# Patient Record
Sex: Male | Born: 1940 | Race: White | Hispanic: No | Marital: Married | Smoking: Former smoker
Health system: Southern US, Community
[De-identification: ages and names within clinical notes are randomized; demographics above are authoritative.]

## PROBLEM LIST (undated history)

## (undated) DIAGNOSIS — I10 Essential (primary) hypertension: Secondary | ICD-10-CM

## (undated) DIAGNOSIS — IMO0001 Reserved for inherently not codable concepts without codable children: Secondary | ICD-10-CM

## (undated) DIAGNOSIS — R972 Elevated prostate specific antigen [PSA]: Secondary | ICD-10-CM

## (undated) DIAGNOSIS — E079 Disorder of thyroid, unspecified: Secondary | ICD-10-CM

## (undated) DIAGNOSIS — K219 Gastro-esophageal reflux disease without esophagitis: Secondary | ICD-10-CM

## (undated) DIAGNOSIS — N400 Enlarged prostate without lower urinary tract symptoms: Secondary | ICD-10-CM

## (undated) DIAGNOSIS — E785 Hyperlipidemia, unspecified: Secondary | ICD-10-CM

## (undated) HISTORY — DX: Benign prostatic hyperplasia without lower urinary tract symptoms: N40.0

## (undated) HISTORY — DX: Hyperlipidemia, unspecified: E78.5

## (undated) HISTORY — DX: Elevated prostate specific antigen (PSA): R97.20

## (undated) HISTORY — DX: Disorder of thyroid, unspecified: E07.9

## (undated) HISTORY — DX: Reserved for inherently not codable concepts without codable children: IMO0001

## (undated) HISTORY — PX: PROSTATE BIOPSY: SHX241

## (undated) HISTORY — PX: WISDOM TOOTH EXTRACTION: SHX21

## (undated) HISTORY — DX: Gastro-esophageal reflux disease without esophagitis: K21.9

## (undated) HISTORY — DX: Essential (primary) hypertension: I10

## (undated) HISTORY — PX: TONSILLECTOMY: SUR1361

---

## 2001-03-13 ENCOUNTER — Other Ambulatory Visit: Admission: RE | Admit: 2001-03-13 | Discharge: 2001-03-13 | Payer: Self-pay | Admitting: Internal Medicine

## 2001-03-13 ENCOUNTER — Encounter (INDEPENDENT_AMBULATORY_CARE_PROVIDER_SITE_OTHER): Payer: Self-pay | Admitting: Specialist

## 2004-04-25 ENCOUNTER — Ambulatory Visit: Payer: Self-pay | Admitting: Family Medicine

## 2004-05-02 ENCOUNTER — Ambulatory Visit: Payer: Self-pay | Admitting: Family Medicine

## 2004-05-09 ENCOUNTER — Encounter: Admission: RE | Admit: 2004-05-09 | Discharge: 2004-05-09 | Payer: Self-pay | Admitting: Family Medicine

## 2004-07-19 ENCOUNTER — Ambulatory Visit: Payer: Self-pay | Admitting: Family Medicine

## 2004-07-24 ENCOUNTER — Encounter: Admission: RE | Admit: 2004-07-24 | Discharge: 2004-07-24 | Payer: Self-pay | Admitting: Family Medicine

## 2005-02-01 ENCOUNTER — Ambulatory Visit: Payer: Self-pay | Admitting: Family Medicine

## 2005-02-23 ENCOUNTER — Ambulatory Visit: Payer: Self-pay | Admitting: Internal Medicine

## 2005-04-25 ENCOUNTER — Ambulatory Visit: Payer: Self-pay | Admitting: Family Medicine

## 2005-06-14 ENCOUNTER — Ambulatory Visit: Payer: Self-pay | Admitting: Family Medicine

## 2005-06-21 ENCOUNTER — Ambulatory Visit: Payer: Self-pay | Admitting: Family Medicine

## 2005-11-15 ENCOUNTER — Ambulatory Visit: Payer: Self-pay | Admitting: Family Medicine

## 2005-11-16 ENCOUNTER — Ambulatory Visit: Payer: Self-pay | Admitting: Family Medicine

## 2005-11-20 ENCOUNTER — Ambulatory Visit: Payer: Self-pay | Admitting: Internal Medicine

## 2006-03-15 ENCOUNTER — Ambulatory Visit: Payer: Self-pay | Admitting: Family Medicine

## 2006-05-07 ENCOUNTER — Ambulatory Visit: Payer: Self-pay | Admitting: Family Medicine

## 2006-06-06 ENCOUNTER — Ambulatory Visit: Payer: Self-pay | Admitting: Internal Medicine

## 2006-07-09 ENCOUNTER — Ambulatory Visit: Payer: Self-pay | Admitting: Internal Medicine

## 2006-07-09 ENCOUNTER — Encounter (INDEPENDENT_AMBULATORY_CARE_PROVIDER_SITE_OTHER): Payer: Self-pay | Admitting: *Deleted

## 2006-07-11 ENCOUNTER — Ambulatory Visit: Payer: Self-pay | Admitting: Family Medicine

## 2006-07-11 LAB — CONVERTED CEMR LAB
ALT: 23 units/L (ref 0–40)
AST: 26 units/L (ref 0–37)
Albumin: 3.9 g/dL (ref 3.5–5.2)
Alkaline Phosphatase: 47 units/L (ref 39–117)
BUN: 14 mg/dL (ref 6–23)
Basophils Absolute: 0 10*3/uL (ref 0.0–0.1)
Basophils Relative: 0.4 % (ref 0.0–1.0)
CO2: 27 meq/L (ref 19–32)
Calcium: 9.5 mg/dL (ref 8.4–10.5)
Chloride: 106 meq/L (ref 96–112)
Cholesterol: 182 mg/dL (ref 0–200)
Creatinine, Ser: 1 mg/dL (ref 0.4–1.5)
Eosinophils Relative: 4.2 % (ref 0.0–5.0)
GFR calc Af Amer: 97 mL/min
GFR calc non Af Amer: 80 mL/min
Glucose, Bld: 110 mg/dL — ABNORMAL HIGH (ref 70–99)
HCT: 43.7 % (ref 39.0–52.0)
HDL: 47.5 mg/dL (ref >39.0)
Hemoglobin: 14.8 g/dL (ref 13.0–17.0)
Hgb A1c MFr Bld: 4.9 % (ref 4.6–6.0)
LDL Cholesterol: 117 mg/dL — ABNORMAL HIGH (ref 0–99)
Lymphocytes Relative: 38.7 % (ref 12.0–46.0)
MCHC: 33.8 g/dL (ref 30.0–36.0)
MCV: 96.5 fL (ref 78.0–100.0)
Monocytes Absolute: 0.4 10*3/uL (ref 0.2–0.7)
Monocytes Relative: 8.1 % (ref 3.0–11.0)
Neutro Abs: 2.4 10*3/uL (ref 1.4–7.7)
Neutrophils Relative %: 48.6 % (ref 43.0–77.0)
PSA: 7.77 ng/mL — ABNORMAL HIGH (ref 0.10–4.00)
Platelets: 155 10*3/uL (ref 150–400)
Potassium: 3.6 meq/L (ref 3.5–5.1)
RBC: 4.53 M/uL (ref 4.22–5.81)
RDW: 12.1 % (ref 11.5–14.6)
Sodium: 139 meq/L (ref 135–145)
TSH: 1.17 microintl units/mL (ref 0.35–5.50)
Total Bilirubin: 1.2 mg/dL (ref 0.3–1.2)
Total CHOL/HDL Ratio: 3.8
Total Protein: 6 g/dL (ref 6.0–8.3)
Triglycerides: 90 mg/dL (ref 0–149)
VLDL: 18 mg/dL (ref 0–40)
WBC: 4.9 10*3/uL (ref 4.5–10.5)

## 2006-07-17 ENCOUNTER — Ambulatory Visit: Payer: Self-pay | Admitting: Family Medicine

## 2006-10-28 ENCOUNTER — Ambulatory Visit: Payer: Self-pay | Admitting: Family Medicine

## 2007-04-17 DIAGNOSIS — E785 Hyperlipidemia, unspecified: Secondary | ICD-10-CM

## 2007-04-17 DIAGNOSIS — E039 Hypothyroidism, unspecified: Secondary | ICD-10-CM

## 2007-04-25 ENCOUNTER — Ambulatory Visit: Payer: Self-pay | Admitting: Family Medicine

## 2007-05-21 ENCOUNTER — Ambulatory Visit: Payer: Self-pay | Admitting: Family Medicine

## 2007-05-21 DIAGNOSIS — J309 Allergic rhinitis, unspecified: Secondary | ICD-10-CM | POA: Insufficient documentation

## 2007-08-27 ENCOUNTER — Ambulatory Visit: Payer: Self-pay | Admitting: Family Medicine

## 2007-08-27 LAB — CONVERTED CEMR LAB
Bilirubin Urine: NEGATIVE
Blood in Urine, dipstick: NEGATIVE
Glucose, Urine, Semiquant: NEGATIVE
Nitrite: NEGATIVE
Protein, U semiquant: NEGATIVE
Specific Gravity, Urine: 1.02
Urobilinogen, UA: 0.2
WBC Urine, dipstick: NEGATIVE
pH: 7

## 2007-08-29 LAB — CONVERTED CEMR LAB
ALT: 25 units/L (ref 0–53)
AST: 23 units/L (ref 0–37)
Albumin: 4 g/dL (ref 3.5–5.2)
Alkaline Phosphatase: 53 units/L (ref 39–117)
BUN: 21 mg/dL (ref 6–23)
Basophils Absolute: 0 10*3/uL (ref 0.0–0.1)
Basophils Relative: 0.2 % (ref 0.0–1.0)
Bilirubin, Direct: 0.2 mg/dL (ref 0.0–0.3)
CO2: 28 meq/L (ref 19–32)
Calcium: 10.1 mg/dL (ref 8.4–10.5)
Chloride: 105 meq/L (ref 96–112)
Cholesterol: 168 mg/dL (ref 0–200)
Creatinine, Ser: 1.2 mg/dL (ref 0.4–1.5)
Eosinophils Absolute: 0.2 10*3/uL (ref 0.0–0.6)
Eosinophils Relative: 3 % (ref 0.0–5.0)
GFR calc Af Amer: 78 mL/min
GFR calc non Af Amer: 65 mL/min
Glucose, Bld: 94 mg/dL (ref 70–99)
HCT: 46 % (ref 39.0–52.0)
HDL: 42.4 mg/dL (ref >39.0)
Hemoglobin: 15.3 g/dL (ref 13.0–17.0)
LDL Cholesterol: 106 mg/dL — ABNORMAL HIGH (ref 0–99)
Lymphocytes Relative: 33.5 % (ref 12.0–46.0)
MCHC: 33.3 g/dL (ref 30.0–36.0)
MCV: 92.7 fL (ref 78.0–100.0)
Monocytes Absolute: 0.4 10*3/uL (ref 0.2–0.7)
Monocytes Relative: 7.3 % (ref 3.0–11.0)
Neutro Abs: 3.4 10*3/uL (ref 1.4–7.7)
Neutrophils Relative %: 56 % (ref 43.0–77.0)
PSA: 9.91 ng/mL — ABNORMAL HIGH (ref 0.10–4.00)
Platelets: 198 10*3/uL (ref 150–400)
Potassium: 4.8 meq/L (ref 3.5–5.1)
RBC: 4.96 M/uL (ref 4.22–5.81)
RDW: 12 % (ref 11.5–14.6)
Sodium: 140 meq/L (ref 135–145)
TSH: 1.3 microintl units/mL (ref 0.35–5.50)
Total Bilirubin: 1 mg/dL (ref 0.3–1.2)
Total CHOL/HDL Ratio: 4
Total Protein: 6.4 g/dL (ref 6.0–8.3)
Triglycerides: 97 mg/dL (ref 0–149)
VLDL: 19 mg/dL (ref 0–40)
WBC: 6 10*3/uL (ref 4.5–10.5)

## 2007-09-01 ENCOUNTER — Ambulatory Visit: Payer: Self-pay | Admitting: Family Medicine

## 2007-09-01 DIAGNOSIS — M1990 Unspecified osteoarthritis, unspecified site: Secondary | ICD-10-CM

## 2007-09-01 DIAGNOSIS — N401 Enlarged prostate with lower urinary tract symptoms: Secondary | ICD-10-CM

## 2007-09-01 DIAGNOSIS — N138 Other obstructive and reflux uropathy: Secondary | ICD-10-CM

## 2007-09-01 DIAGNOSIS — K219 Gastro-esophageal reflux disease without esophagitis: Secondary | ICD-10-CM | POA: Insufficient documentation

## 2007-12-01 ENCOUNTER — Ambulatory Visit: Payer: Self-pay | Admitting: Family Medicine

## 2007-12-08 ENCOUNTER — Ambulatory Visit: Payer: Self-pay

## 2007-12-08 ENCOUNTER — Encounter: Payer: Self-pay | Admitting: Family Medicine

## 2008-03-22 ENCOUNTER — Ambulatory Visit: Payer: Self-pay | Admitting: Family Medicine

## 2008-05-04 ENCOUNTER — Ambulatory Visit: Payer: Self-pay | Admitting: Family Medicine

## 2008-09-16 ENCOUNTER — Ambulatory Visit: Payer: Self-pay | Admitting: Family Medicine

## 2008-09-16 LAB — CONVERTED CEMR LAB
Bilirubin Urine: NEGATIVE
Blood in Urine, dipstick: NEGATIVE
Glucose, Urine, Semiquant: NEGATIVE
Nitrite: NEGATIVE
Protein, U semiquant: NEGATIVE
Specific Gravity, Urine: 1.015
Urobilinogen, UA: 0.2
WBC Urine, dipstick: NEGATIVE
pH: 7

## 2008-09-17 LAB — CONVERTED CEMR LAB
ALT: 30 units/L (ref 0–53)
AST: 26 units/L (ref 0–37)
Albumin: 4.2 g/dL (ref 3.5–5.2)
Alkaline Phosphatase: 50 units/L (ref 39–117)
BUN: 17 mg/dL (ref 6–23)
Basophils Absolute: 0.1 10*3/uL (ref 0.0–0.1)
Basophils Relative: 1 % (ref 0.0–3.0)
Bilirubin, Direct: 0 mg/dL (ref 0.0–0.3)
CO2: 31 meq/L (ref 19–32)
Calcium: 9.3 mg/dL (ref 8.4–10.5)
Chloride: 106 meq/L (ref 96–112)
Cholesterol: 152 mg/dL (ref 0–200)
Creatinine, Ser: 0.9 mg/dL (ref 0.4–1.5)
Eosinophils Absolute: 0.2 10*3/uL (ref 0.0–0.7)
Eosinophils Relative: 3.8 % (ref 0.0–5.0)
GFR calc non Af Amer: 89.88 mL/min (ref >60)
Glucose, Bld: 84 mg/dL (ref 70–99)
HCT: 43.6 % (ref 39.0–52.0)
HDL: 39.5 mg/dL (ref >39.00)
Hemoglobin: 15.4 g/dL (ref 13.0–17.0)
LDL Cholesterol: 96 mg/dL (ref 0–99)
Lymphocytes Relative: 36.5 % (ref 12.0–46.0)
Lymphs Abs: 1.9 10*3/uL (ref 0.7–4.0)
MCHC: 35.4 g/dL (ref 30.0–36.0)
MCV: 92.3 fL (ref 78.0–100.0)
Monocytes Absolute: 0.4 10*3/uL (ref 0.1–1.0)
Monocytes Relative: 7.6 % (ref 3.0–12.0)
Neutro Abs: 2.6 10*3/uL (ref 1.4–7.7)
Neutrophils Relative %: 51.1 % (ref 43.0–77.0)
PSA: 7.63 ng/mL — ABNORMAL HIGH (ref 0.10–4.00)
Platelets: 145 10*3/uL — ABNORMAL LOW (ref 150.0–400.0)
Potassium: 3.8 meq/L (ref 3.5–5.1)
RBC: 4.72 M/uL (ref 4.22–5.81)
RDW: 12.2 % (ref 11.5–14.6)
Sodium: 142 meq/L (ref 135–145)
TSH: 1.23 microintl units/mL (ref 0.35–5.50)
Total Bilirubin: 1.2 mg/dL (ref 0.3–1.2)
Total CHOL/HDL Ratio: 4
Total Protein: 6.6 g/dL (ref 6.0–8.3)
Triglycerides: 82 mg/dL (ref 0.0–149.0)
VLDL: 16.4 mg/dL (ref 0.0–40.0)
WBC: 5.2 10*3/uL (ref 4.5–10.5)

## 2008-09-28 ENCOUNTER — Ambulatory Visit: Payer: Self-pay | Admitting: Family Medicine

## 2008-10-11 ENCOUNTER — Ambulatory Visit: Payer: Self-pay | Admitting: Family Medicine

## 2009-04-18 ENCOUNTER — Ambulatory Visit: Payer: Self-pay | Admitting: Family Medicine

## 2009-07-04 ENCOUNTER — Ambulatory Visit: Payer: Self-pay | Admitting: Family Medicine

## 2009-07-04 DIAGNOSIS — I10 Essential (primary) hypertension: Secondary | ICD-10-CM | POA: Insufficient documentation

## 2009-08-03 ENCOUNTER — Ambulatory Visit: Payer: Self-pay | Admitting: Family Medicine

## 2009-08-03 LAB — CONVERTED CEMR LAB
ALT: 25 units/L (ref 0–53)
AST: 26 units/L (ref 0–37)
Albumin: 4.3 g/dL (ref 3.5–5.2)
Alkaline Phosphatase: 57 units/L (ref 39–117)
BUN: 20 mg/dL (ref 6–23)
Basophils Absolute: 0 10*3/uL (ref 0.0–0.1)
Basophils Relative: 0.5 % (ref 0.0–3.0)
Bilirubin, Direct: 0.1 mg/dL (ref 0.0–0.3)
CO2: 29 meq/L (ref 19–32)
Calcium: 9.6 mg/dL (ref 8.4–10.5)
Chloride: 107 meq/L (ref 96–112)
Cholesterol: 145 mg/dL (ref 0–200)
Creatinine, Ser: 1 mg/dL (ref 0.4–1.5)
Eosinophils Absolute: 0.2 10*3/uL (ref 0.0–0.7)
Eosinophils Relative: 2.9 % (ref 0.0–5.0)
GFR calc non Af Amer: 79.37 mL/min (ref >60)
Glucose, Bld: 86 mg/dL (ref 70–99)
HCT: 42.5 % (ref 39.0–52.0)
HDL: 50 mg/dL (ref >39.00)
Hemoglobin: 14.7 g/dL (ref 13.0–17.0)
LDL Cholesterol: 82 mg/dL (ref 0–99)
Lymphocytes Relative: 30.9 % (ref 12.0–46.0)
Lymphs Abs: 1.6 10*3/uL (ref 0.7–4.0)
MCHC: 34.4 g/dL (ref 30.0–36.0)
MCV: 92.7 fL (ref 78.0–100.0)
Monocytes Absolute: 0.4 10*3/uL (ref 0.1–1.0)
Monocytes Relative: 7.1 % (ref 3.0–12.0)
Neutro Abs: 3 10*3/uL (ref 1.4–7.7)
Neutrophils Relative %: 58.6 % (ref 43.0–77.0)
PSA: 8.84 ng/mL — ABNORMAL HIGH (ref 0.10–4.00)
Platelets: 172 10*3/uL (ref 150.0–400.0)
Potassium: 4.4 meq/L (ref 3.5–5.1)
RBC: 4.59 M/uL (ref 4.22–5.81)
RDW: 12.1 % (ref 11.5–14.6)
Sodium: 142 meq/L (ref 135–145)
TSH: 0.92 microintl units/mL (ref 0.35–5.50)
Total Bilirubin: 0.7 mg/dL (ref 0.3–1.2)
Total CHOL/HDL Ratio: 3
Total Protein: 6.6 g/dL (ref 6.0–8.3)
Triglycerides: 67 mg/dL (ref 0.0–149.0)
VLDL: 13.4 mg/dL (ref 0.0–40.0)
WBC: 5.2 10*3/uL (ref 4.5–10.5)

## 2009-10-24 ENCOUNTER — Telehealth: Payer: Self-pay | Admitting: Family Medicine

## 2009-11-01 ENCOUNTER — Ambulatory Visit: Payer: Self-pay | Admitting: Family Medicine

## 2010-04-18 ENCOUNTER — Ambulatory Visit: Payer: Self-pay | Admitting: Family Medicine

## 2010-07-25 NOTE — Progress Notes (Signed)
Summary: samples needed  Phone Note Call from Patient Call back at Work Phone [phone]   Caller: Patient---live call Summary of Call: Has appt on next Tuesday 11-01-2009. Was put on new sample of benicar 40mg  1qd. Need more samples until his ov next week. Call when ready. Initial call taken by: Warnell Forester,  Oct 24, 2009 10:52 AM  Follow-up for Phone Call        please give him a 2 weeks supply Follow-up by: Nelwyn Salisbury MD,  Oct 24, 2009 12:55 PM  Additional Follow-up for Phone Call Additional follow up Details #1::        called. Additional Follow-up by: Raechel Ache, RN,  Oct 24, 2009 1:16 PM

## 2010-07-25 NOTE — Assessment & Plan Note (Signed)
Summary: F/U ON SHORTNESS OF BREATH // RS   Vital Signs:  Patient profile:   70 year old male Weight:      152 pounds O2 Sat:      98 % Temp:     98.2 degrees F oral Pulse rhythm:   regular BP sitting:   120 / 78  Vitals Entered By: Lynann Beaver CMA (August 03, 2009 8:34 AM) CC: recheck SOB and BP Is Patient Diabetic? No Pain Assessment Patient in pain? no        History of Present Illness: Here to follow up on an intermittent fullness sensation in the neck and chest and mild SOB. Since our last visit he has eliminated all caffeine, but the sensations persist. They do not bother him when he is walking oe exercising. No cough. On 07-04-09 he had a normal CXR. We have spoken before that the possibility exists that this is a side effect of his ACE inhibitor.   Current Medications (verified): 1)  Levothroid 75 Mcg  Tabs (Levothyroxine Sodium) .Marland Kitchen.. 1 By Mouth Once Daily 2)  Captopril 25 Mg Tabs (Captopril) .Marland Kitchen.. 1 By Mouth Once Daily 3)  Crestor 10 Mg Tabs (Rosuvastatin Calcium) .... Once Daily 4)  Prilosec Otc 20 Mg Tbec (Omeprazole Magnesium) .Marland Kitchen.. 1 By Mouth Once Daily 5)  Multivitamins   Tabs (Multiple Vitamin) .Marland Kitchen.. 1 By Mouth Once Daily  Allergies (verified): No Known Drug Allergies  Past History:  Past Medical History: Reviewed history from 09/01/2007 and no changes required. Hyperlipidemia Hypothyroidism Benign prostatic hypertrophy with elevated PSA, sees Dr. Gerome Sam GERD Osteoarthritis  Review of Systems  The patient denies anorexia, fever, weight loss, weight gain, vision loss, decreased hearing, hoarseness, chest pain, syncope, dyspnea on exertion, peripheral edema, prolonged cough, headaches, hemoptysis, abdominal pain, melena, hematochezia, severe indigestion/heartburn, hematuria, incontinence, genital sores, muscle weakness, suspicious skin lesions, transient blindness, difficulty walking, depression, unusual weight change, abnormal bleeding, enlarged lymph  nodes, angioedema, breast masses, and testicular masses.    Physical Exam  General:  Well-developed,well-nourished,in no acute distress; alert,appropriate and cooperative throughout examination Neck:  No deformities, masses, or tenderness noted. Lungs:  Normal respiratory effort, chest expands symmetrically. Lungs are clear to auscultation, no crackles or wheezes. Heart:  Normal rate and regular rhythm. S1 and S2 normal without gallop, murmur, click, rub or other extra sounds.   Impression & Recommendations:  Problem # 1:  SHORTNESS OF BREATH (ICD-786.05)  Problem # 2:  ESSENTIAL HYPERTENSION (ICD-401.9)  The following medications were removed from the medication list:    Captopril 25 Mg Tabs (Captopril) .Marland Kitchen... 1 by mouth once daily His updated medication list for this problem includes:    Benicar 40 Mg Tabs (Olmesartan medoxomil) ..... Once daily  Orders: UA Dipstick w/o Micro (automated)  (81003) Venipuncture (46962) TLB-Lipid Panel (80061-LIPID) TLB-BMP (Basic Metabolic Panel-BMET) (80048-METABOL) TLB-CBC Platelet - w/Differential (85025-CBCD) TLB-Hepatic/Liver Function Pnl (80076-HEPATIC) TLB-TSH (Thyroid Stimulating Hormone) (84443-TSH)  Complete Medication List: 1)  Levothroid 75 Mcg Tabs (Levothyroxine sodium) .Marland Kitchen.. 1 by mouth once daily 2)  Crestor 10 Mg Tabs (Rosuvastatin calcium) .... Once daily 3)  Prilosec Otc 20 Mg Tbec (Omeprazole magnesium) .Marland Kitchen.. 1 by mouth once daily 4)  Multivitamins Tabs (Multiple vitamin) .Marland Kitchen.. 1 by mouth once daily 5)  Benicar 40 Mg Tabs (Olmesartan medoxomil) .... Once daily  Other Orders: TLB-PSA (Prostate Specific Antigen) (84153-PSA)  Patient Instructions: 1)  Get labs today. We will stop the Captopril and try Benicar daily.  2)  Please schedule a follow-up  appointment in 3 months .   Appended Document: F/U ON SHORTNESS OF BREATH // RS  Laboratory Results   Urine Tests    Routine Urinalysis   Color: yellow Appearance:  Clear Glucose: negative   (Normal Range: Negative) Bilirubin: negative   (Normal Range: Negative) Ketone: trace (5)   (Normal Range: Negative) Spec. Gravity: 1.025   (Normal Range: 1.003-1.035) Blood: negative   (Normal Range: Negative) Protein: negative   (Normal Range: Negative) Urobilinogen: 0.2   (Normal Range: 0-1) Nitrite: negative   (Normal Range: Negative) Leukocyte Esterace: negative   (Normal Range: Negative)    Comments: Rita Ohara  August 03, 2009 11:35 AM

## 2010-07-25 NOTE — Assessment & Plan Note (Signed)
Summary: URI SYMPTOMS X 3-4 WKS // RS   Vital Signs:  Patient profile:   70 year old male Weight:      155 pounds BMI:     26.70 Temp:     97.8 degrees F oral BP sitting:   140 / 88  (left arm) Cuff size:   regular  Vitals Entered By: Alfred Levins, CMA (July 04, 2009 2:25 PM) CC: cough x1 mth   History of Present Illness: Here for one month of an occasional sensation that he needs to take a deep breath. No SOB on exertion, in fact he worksout regularly and never has to stop from SOB. No cough or fever. No chest pains. No heartburn or indigestion. He has been drinking more caffeine than usual lately, often 3 cups a coffee a day. Of note he has been on captopril for years now for his BP.   Current Medications (verified): 1)  Levothroid 75 Mcg  Tabs (Levothyroxine Sodium) .Marland Kitchen.. 1 By Mouth Once Daily 2)  Captopril 25 Mg Tabs (Captopril) .Marland Kitchen.. 1 By Mouth Once Daily 3)  Crestor 10 Mg Tabs (Rosuvastatin Calcium) .... Once Daily 4)  Prilosec Otc 20 Mg Tbec (Omeprazole Magnesium) .Marland Kitchen.. 1 By Mouth Once Daily 5)  Multivitamins   Tabs (Multiple Vitamin) .Marland Kitchen.. 1 By Mouth Once Daily  Allergies (verified): No Known Drug Allergies  Past History:  Past Medical History: Reviewed history from 09/01/2007 and no changes required. Hyperlipidemia Hypothyroidism Benign prostatic hypertrophy with elevated PSA, sees Dr. Gerome Sam GERD Osteoarthritis  Review of Systems  The patient denies anorexia, fever, weight loss, weight gain, vision loss, decreased hearing, hoarseness, chest pain, syncope, dyspnea on exertion, peripheral edema, prolonged cough, headaches, hemoptysis, abdominal pain, melena, hematochezia, severe indigestion/heartburn, hematuria, incontinence, genital sores, muscle weakness, suspicious skin lesions, transient blindness, difficulty walking, depression, unusual weight change, abnormal bleeding, enlarged lymph nodes, angioedema, breast masses, and testicular masses.    Physical  Exam  General:  Well-developed,well-nourished,in no acute distress; alert,appropriate and cooperative throughout examination Neck:  No deformities, masses, or tenderness noted. Lungs:  Normal respiratory effort, chest expands symmetrically. Lungs are clear to auscultation, no crackles or wheezes. Heart:  Normal rate and regular rhythm. S1 and S2 normal without gallop, murmur, click, rub or other extra sounds.   Impression & Recommendations:  Problem # 1:  SHORTNESS OF BREATH (ICD-786.05)  Orders: T-2 View CXR (71020TC)  Problem # 2:  ESSENTIAL HYPERTENSION (ICD-401.9)  His updated medication list for this problem includes:    Captopril 25 Mg Tabs (Captopril) .Marland Kitchen... 1 by mouth once daily  Complete Medication List: 1)  Levothroid 75 Mcg Tabs (Levothyroxine sodium) .Marland Kitchen.. 1 by mouth once daily 2)  Captopril 25 Mg Tabs (Captopril) .Marland Kitchen.. 1 by mouth once daily 3)  Crestor 10 Mg Tabs (Rosuvastatin calcium) .... Once daily 4)  Prilosec Otc 20 Mg Tbec (Omeprazole magnesium) .Marland Kitchen.. 1 by mouth once daily 5)  Multivitamins Tabs (Multiple vitamin) .Marland Kitchen.. 1 by mouth once daily  Patient Instructions: 1)  We will get a CXR today since he has not had one in many years. he thinks these recent sensations may be due to too much caffeine, and this is certainly possible. he will stop all caffeine use, and we will talk again in one week.   If they do not go away, we may try stopping his Captopril, since this could be an ACE inhibitor type of effect.  Prescriptions: CRESTOR 10 MG TABS (ROSUVASTATIN CALCIUM) once daily  #30 x 11  Entered and Authorized by:   Nelwyn Salisbury MD   Signed by:   Nelwyn Salisbury MD on 07/04/2009   Method used:   Electronically to        [facility] (retail)       [address], [address]       [place], [place]  [address]       Ph: [phone]       Fax: [phone]   RxID:   [id number] CAPTOPRIL 25 MG TABS (CAPTOPRIL) 1 by mouth once daily   #30 x 11   Entered and Authorized by:   Nelwyn Salisbury MD   Signed by:   Nelwyn Salisbury MD on 07/04/2009   Method used:   Electronically to        [facility] [address] (retail)       [address]       [place], [place]  [address]       Ph: [phone]       Fax: [phone]   RxID:   [id number] LEVOTHROID 75 MCG  TABS (LEVOTHYROXINE SODIUM) 1 by mouth once daily  #30 x 11   Entered and Authorized by:   Nelwyn Salisbury MD   Signed by:   Nelwyn Salisbury MD on 07/04/2009   Method used:   Electronically to        [facility] (retail)       [address], [address]       [place], [place]  [address]       Ph: [phone]       Fax: [phone]   RxID:   [id number]

## 2010-07-25 NOTE — Assessment & Plan Note (Signed)
Summary: CPX//ALP   Vital Signs:  Patient profile:   70 year old male Weight:      155 pounds BMI:     26.70 BP sitting:   112 / 80  (left arm) Cuff size:   regular  Vitals Entered By: Raechel Ache, RN (Nov 01, 2009 9:48 AM) CC: OV, labs done.   History of Present Illness: 70 yr old male for a cpx. He feels fine and has no concerns. He saw Dr. Merry Lofty this am, and he seems to be doing well. His PSA counts stay in the range of 7 to 9 all the time.   Allergies (verified): No Known Drug Allergies  Past History:  Past Medical History: Hyperlipidemia Hypothyroidism Benign prostatic hypertrophy with elevated PSA, sees Dr. Gerome Sam GERD Osteoarthritis Hypertension  Past Surgical History: Reviewed history from 09/01/2007 and no changes required. Tonsillectomy Sigmoidoscopy Colonoscopy 07/09/2006 per Dr. Marina Goodell, repeat in 5 yrs  Family History: Reviewed history from 05/21/2007 and no changes required. Family History of CAD Male 1st degree relative <60 Family History of Colon CA 1st degree relative <60 Family History of Aneurysm Aortic  Social History: Reviewed history from 05/21/2007 and no changes required. Married Never Smoked Alcohol use-no Drug use-no  Review of Systems  The patient denies anorexia, fever, weight loss, weight gain, vision loss, decreased hearing, hoarseness, chest pain, syncope, dyspnea on exertion, peripheral edema, prolonged cough, headaches, hemoptysis, abdominal pain, melena, hematochezia, severe indigestion/heartburn, hematuria, incontinence, genital sores, muscle weakness, suspicious skin lesions, transient blindness, difficulty walking, depression, unusual weight change, abnormal bleeding, enlarged lymph nodes, angioedema, breast masses, and testicular masses.    Physical Exam  General:  Well-developed,well-nourished,in no acute distress; alert,appropriate and cooperative throughout examination Head:  Normocephalic and atraumatic  without obvious abnormalities. No apparent alopecia or balding. Eyes:  No corneal or conjunctival inflammation noted. EOMI. Perrla. Funduscopic exam benign, without hemorrhages, exudates or papilledema. Vision grossly normal. Ears:  External ear exam shows no significant lesions or deformities.  Otoscopic examination reveals clear canals, tympanic membranes are intact bilaterally without bulging, retraction, inflammation or discharge. Hearing is grossly normal bilaterally. Nose:  External nasal examination shows no deformity or inflammation. Nasal mucosa are pink and moist without lesions or exudates. Mouth:  Oral mucosa and oropharynx without lesions or exudates.  Teeth in good repair. Neck:  No deformities, masses, or tenderness noted. Chest Wall:  No deformities, masses, tenderness or gynecomastia noted. Lungs:  Normal respiratory effort, chest expands symmetrically. Lungs are clear to auscultation, no crackles or wheezes. Heart:  Normal rate and regular rhythm. S1 and S2 normal without gallop, murmur, click, rub or other extra sounds. EKG normal Abdomen:  Bowel sounds positive,abdomen soft and non-tender without masses, organomegaly or hernias noted. Msk:  No deformity or scoliosis noted of thoracic or lumbar spine.   Pulses:  R and L carotid,radial,femoral,dorsalis pedis and posterior tibial pulses are full and equal bilaterally Extremities:  No clubbing, cyanosis, edema, or deformity noted with normal full range of motion of all joints.   Neurologic:  No cranial nerve deficits noted. Station and gait are normal. Plantar reflexes are down-going bilaterally. DTRs are symmetrical throughout. Sensory, motor and coordinative functions appear intact. Skin:  Intact without suspicious lesions or rashes Cervical Nodes:  No lymphadenopathy noted Axillary Nodes:  No palpable lymphadenopathy Inguinal Nodes:  No significant adenopathy Psych:  Cognition and judgment appear intact. Alert and cooperative  with normal attention span and concentration. No apparent delusions, illusions, hallucinations   Impression & Recommendations:  Problem # 1:  HYPERTENSION (ICD-401.9)  The following medications were removed from the medication list:    Benicar 40 Mg Tabs (Olmesartan medoxomil) ..... Once daily His updated medication list for this problem includes:    Losartan Potassium 100 Mg Tabs (Losartan potassium) ..... Once daily  Orders: EKG w/ Interpretation (93000) Prescription Created Electronically [phone])  Problem # 2:  SHORTNESS OF BREATH (ICD-786.05)  Problem # 3:  OSTEOARTHRITIS (ICD-715.90)  Problem # 4:  GERD (ICD-530.81)  His updated medication list for this problem includes:    Prilosec Otc 20 Mg Tbec (Omeprazole magnesium) .Marland Kitchen... 1 by mouth once daily  Problem # 5:  BENIGN PROSTATIC HYPERTROPHY (ICD-600.00)  Problem # 6:  HYPOTHYROIDISM (ICD-244.9)  His updated medication list for this problem includes:    Levothroid 75 Mcg Tabs (Levothyroxine sodium) .Marland Kitchen... 1 by mouth once daily  Problem # 7:  HYPERLIPIDEMIA (ICD-272.4)  His updated medication list for this problem includes:    Crestor 10 Mg Tabs (Rosuvastatin calcium) ..... Once daily  Complete Medication List: 1)  Levothroid 75 Mcg Tabs (Levothyroxine sodium) .Marland Kitchen.. 1 by mouth once daily 2)  Crestor 10 Mg Tabs (Rosuvastatin calcium) .... Once daily 3)  Prilosec Otc 20 Mg Tbec (Omeprazole magnesium) .Marland Kitchen.. 1 by mouth once daily 4)  Multivitamins Tabs (Multiple vitamin) .Marland Kitchen.. 1 by mouth once daily 5)  Losartan Potassium 100 Mg Tabs (Losartan potassium) .... Once daily  Patient Instructions: 1)  Switch to Losartan since it is generic.  2)  Please schedule a follow-up appointment in 6 months .  Prescriptions: LOSARTAN POTASSIUM 100 MG TABS (LOSARTAN POTASSIUM) once daily  #30 x 11   Entered and Authorized by:   Nelwyn Salisbury MD   Signed by:   Nelwyn Salisbury MD on 11/01/2009   Method used:   Electronically to         [facility])       [address]       [place], [place]  [address]       Ph: [phone]       Fax: [phone]   RxID:   [id number]

## 2010-07-25 NOTE — Assessment & Plan Note (Signed)
Summary: Flu shot   Allergies: No Known Drug Allergies   Complete Medication List: 1)  Levothroid 75 Mcg Tabs (Levothyroxine sodium) .Marland Kitchen.. 1 by mouth once daily 2)  Crestor 10 Mg Tabs (Rosuvastatin calcium) .... Once daily 3)  Prilosec Otc 20 Mg Tbec (Omeprazole magnesium) .Marland Kitchen.. 1 by mouth once daily 4)  Multivitamins Tabs (Multiple vitamin) .Marland Kitchen.. 1 by mouth once daily 5)  Losartan Potassium 100 Mg Tabs (Losartan potassium) .... Once daily  Other Orders: Flu Vaccine 49yrs + MEDICARE PATIENTS (Z6109) Administration Flu vaccine - MCR (G0008)   Orders Added: 1)  Flu Vaccine 67yrs + MEDICARE PATIENTS [Q2039] 2)  Administration Flu vaccine - MCR [G0008]  Flu Vaccine Consent Questions     Do you have a history of severe allergic reactions to this vaccine? no    Any prior history of allergic reactions to egg and/or gelatin? no    Do you have a sensitivity to the preservative Thimersol? no    Do you have a past history of Guillan-Barre Syndrome? no    Do you currently have an acute febrile illness? no    Have you ever had a severe reaction to latex? no    Vaccine information given and explained to patient? yes    Are you currently pregnant? no    Lot Number:[id number]   Exp Date:12/23/2010   Site Given  Left Deltoid IM        [email or web address]

## 2010-09-06 ENCOUNTER — Encounter: Payer: Self-pay | Admitting: Family Medicine

## 2010-09-06 ENCOUNTER — Ambulatory Visit (INDEPENDENT_AMBULATORY_CARE_PROVIDER_SITE_OTHER): Payer: Medicare Other | Admitting: Family Medicine

## 2010-09-06 VITALS — BP 120/80 | HR 71 | Temp 98.4°F | Wt 143.0 lb

## 2010-09-06 DIAGNOSIS — M7520 Bicipital tendinitis, unspecified shoulder: Secondary | ICD-10-CM

## 2010-09-06 DIAGNOSIS — M25519 Pain in unspecified shoulder: Secondary | ICD-10-CM

## 2010-09-06 NOTE — Progress Notes (Signed)
  Subjective:    Patient ID: Mason [name], male    DOB: May 11, 1941, 70 y.o.   MRN: [id number]  HPI Here for one week of pain in the anterior left shoulder. He had some stiffness in the shoulder several weeks ago and decided to do some exercises to loosen it up. He has been doing a lot of resistance work with rubber bands this week, but the pain has gotten worse. No neck pain, no numbness or weakness down the arm. Motrin helps.   Review of Systems  Constitutional: Negative.   Musculoskeletal: Positive for arthralgias.       Objective:   Physical Exam  Constitutional: He appears well-developed and well-nourished.  Musculoskeletal:       Tender over the proximal insertion of the long head of the left biceps. Full ROM, no swelling.           Assessment & Plan:  Rest, ice packs, Motrin. Return if not better in 2 weeks

## 2010-09-25 ENCOUNTER — Other Ambulatory Visit: Payer: Self-pay | Admitting: Family Medicine

## 2010-09-25 NOTE — Telephone Encounter (Signed)
Pt is in [place] and is needing to get a script for Losartan 100 mg sent [facility] in [facility]  Fax # is [phone].

## 2010-09-26 MED ORDER — LOSARTAN POTASSIUM 100 MG PO TABS: 100.0000 mg | ORAL_TABLET | Freq: Every day | ORAL | Status: DC

## 2010-09-26 NOTE — Telephone Encounter (Signed)
rx for losartan 100mg   faxed to [facility] in [place] at [phone] per pt request.

## 2010-11-10 NOTE — Assessment & Plan Note (Signed)
[facility] OFFICE NOTE   BRITNEY, CAPTAIN                    MRN:          [id number]  DATE:07/17/2006                            DOB:          1941-03-14    This is a 70 year old gentleman here for complete physical examination.  In general, he is doing fairly well with no new complaints.  I have seen  him a number of times complaining of intermittent slight discomfort in  the right side of the neck or the right side of the throat over the past  year.  He has seen ear, nose and throat specialists.  He has had  numerous evaluations including x-rays and even laryngoscopies with  nothing being found.  At this point he is content to simply observe it  and let us know if anything changes.  He has no difficulty swallowing.  He continues to see Dr. __________  once or twice a year for elevated  PSAs.  He did have a round of biopsies a couple of years ago that were  all negative.  He has no urinary symptoms.  He had a colonoscopy last  week with Dr. Marina Goodell, apparently had a couple of benign polyps removed.  He was told to have a 5-year follow-up.   For further details of his past medical history, family history, social  history, allergies, etc., refer to our last physical note dated June 21, 2005.   ALLERGIES:  None.   CURRENT MEDICATIONS:  1. Levothroid 75 mcg per day.  2. Captopril 25 mg b.i.d.  3. Relafen 750 mg two tablets a day.  4. Multivitamin daily.  5. Crestor 10 mg one every other day.  6. Prilosec OTC as needed.   OBJECTIVE:  VITAL SIGNS:  Height 5 feet 4 inches, weight 154, BP 132/82,  pulse 76 and regular.  GENERAL:  He appears to be doing well.  SKIN:  Clear.  HEENT:  Eyes clear.  Oropharynx clear.  NECK:  Supple without lymphadenopathy or masses.  LUNGS:  Clear.  CARDIAC:  Rate and rhythm are regular without gallops, murmurs or rubs.  Distal pulses are full.  EKG is within normal  limits.  ABDOMEN:  Soft, normal bowel sounds, nontender, no masses.  GENITOURINARY, RECTAL:  I did not do a genitalia exam or a rectal exam  since has recently seen Dr. __________  and Dr. Marina Goodell.  EXTREMITIES:  No clubbing, cyanosis, or edema.  NEUROLOGIC:  Grossly intact.   He was here for fasting labs on January 17.  These were remarkable  primarily for his lipid panel and his PSA.  Total cholesterol was  excellent at 182.  HDL was excellent at 47, LDL slightly high at 117.  His PSA remains high at 7.77, which is actually stable for him.  TSH is  normal.  Everything else was within normal limits.   ASSESSMENT AND PLAN:  1. Complete physical.  I encouraged him to continue doing this on a      yearly basis.  2. Elevated PSA.  He will follow up with Dr. __________ .  3. Neck discomfort of uncertain etiology.  We will simply observe it      for now and he will let me know if it changes.  4. Hyperlipidemia.  Stable.  5. Hypertension.  Stable.  6. Degenerative arthritis.  Stable.  7. Hypothyroidism.  Stable.     Tera Mater. Clent Ridges, MD  Electronically Signed    SAF/MedQ  DD: 07/18/2006  DT: 07/18/2006  Job #: [id number]

## 2010-11-22 ENCOUNTER — Other Ambulatory Visit: Payer: Self-pay | Admitting: Family Medicine

## 2010-12-19 ENCOUNTER — Other Ambulatory Visit (INDEPENDENT_AMBULATORY_CARE_PROVIDER_SITE_OTHER): Payer: Medicare Other

## 2010-12-19 DIAGNOSIS — Z0000 Encounter for general adult medical examination without abnormal findings: Secondary | ICD-10-CM

## 2010-12-19 LAB — CBC WITH DIFFERENTIAL/PLATELET
Basophils Absolute: 0 10*3/uL (ref 0.0–0.1)
Eosinophils Relative: 1.9 % (ref 0.0–5.0)
HCT: 40 % (ref 39.0–52.0)
Hemoglobin: 13.9 g/dL (ref 13.0–17.0)
Lymphocytes Relative: 38 % (ref 12.0–46.0)
Lymphs Abs: 2.3 10*3/uL (ref 0.7–4.0)
MCHC: 34.7 g/dL (ref 30.0–36.0)
MCV: 94.4 fl (ref 78.0–100.0)
Monocytes Absolute: 0.4 10*3/uL (ref 0.1–1.0)
Monocytes Relative: 6.8 % (ref 3.0–12.0)
Neutro Abs: 3.3 10*3/uL (ref 1.4–7.7)
Neutrophils Relative %: 52.7 % (ref 43.0–77.0)
Platelets: 171 10*3/uL (ref 150.0–400.0)
RBC: 4.24 Mil/uL (ref 4.22–5.81)
RDW: 13.1 % (ref 11.5–14.6)
WBC: 6.2 10*3/uL (ref 4.5–10.5)

## 2010-12-19 LAB — BASIC METABOLIC PANEL WITH GFR
BUN: 21 mg/dL (ref 6–23)
CO2: 27 meq/L (ref 19–32)
Calcium: 9 mg/dL (ref 8.4–10.5)
Chloride: 108 meq/L (ref 96–112)
Creatinine, Ser: 1 mg/dL (ref 0.4–1.5)
GFR: 77.25 mL/min
Glucose, Bld: 87 mg/dL (ref 70–99)
Potassium: 3.6 meq/L (ref 3.5–5.1)
Sodium: 142 meq/L (ref 135–145)

## 2010-12-19 LAB — POCT URINALYSIS DIPSTICK
Bilirubin, UA: NEGATIVE
Blood, UA: NEGATIVE
Glucose, UA: NEGATIVE
Ketones, UA: NEGATIVE
Leukocytes, UA: NEGATIVE
Nitrite, UA: NEGATIVE
Protein, UA: NEGATIVE
Spec Grav, UA: 1.025
Urobilinogen, UA: 0.2
pH, UA: 5.5

## 2010-12-19 LAB — LIPID PANEL
Cholesterol: 250 mg/dL — ABNORMAL HIGH (ref 0–200)
HDL: 53.1 mg/dL
Total CHOL/HDL Ratio: 5
Triglycerides: 74 mg/dL (ref 0.0–149.0)
VLDL: 14.8 mg/dL (ref 0.0–40.0)

## 2010-12-19 LAB — HEPATIC FUNCTION PANEL
ALT: 16 U/L (ref 0–53)
AST: 19 U/L (ref 0–37)
Albumin: 4 g/dL (ref 3.5–5.2)
Total Bilirubin: 1.2 mg/dL (ref 0.3–1.2)
Total Protein: 6.1 g/dL (ref 6.0–8.3)

## 2010-12-19 LAB — LDL CHOLESTEROL, DIRECT: Direct LDL: 163.2 mg/dL

## 2010-12-20 LAB — PSA: PSA: 6.25 ng/mL — ABNORMAL HIGH (ref 0.10–4.00)

## 2010-12-20 LAB — TSH: TSH: 2.03 u[IU]/mL (ref 0.35–5.50)

## 2010-12-26 ENCOUNTER — Ambulatory Visit (INDEPENDENT_AMBULATORY_CARE_PROVIDER_SITE_OTHER): Payer: Medicare Other | Admitting: Family Medicine

## 2010-12-26 ENCOUNTER — Encounter: Payer: Self-pay | Admitting: Family Medicine

## 2010-12-26 VITALS — BP 130/80 | HR 66 | Temp 97.7°F | Ht 64.5 in | Wt 144.5 lb

## 2010-12-26 DIAGNOSIS — Z0000 Encounter for general adult medical examination without abnormal findings: Secondary | ICD-10-CM

## 2010-12-26 DIAGNOSIS — R972 Elevated prostate specific antigen [PSA]: Secondary | ICD-10-CM

## 2010-12-26 DIAGNOSIS — E785 Hyperlipidemia, unspecified: Secondary | ICD-10-CM

## 2010-12-26 DIAGNOSIS — I10 Essential (primary) hypertension: Secondary | ICD-10-CM

## 2010-12-26 MED ORDER — LEVOTHYROXINE SODIUM 75 MCG PO TABS: 75.0000 ug | ORAL_TABLET | Freq: Every day | ORAL | Status: DC

## 2010-12-26 MED ORDER — ROSUVASTATIN CALCIUM 10 MG PO TABS: 10.0000 mg | ORAL_TABLET | Freq: Every day | ORAL | Status: DC

## 2010-12-26 NOTE — Progress Notes (Signed)
  Subjective:    Patient ID: Mason [name], male    DOB: May 18, 1941, 70 y.o.   MRN: [id number]  HPI 70 yr old male for a cpx. He feels well and has no concerns. He still sees Dr. Lowella Curb yearly. He stopped taking Crestor a few months ago to see what diet alone can do, and his LDL jumped up to 163.    Review of Systems  Constitutional: Negative.   HENT: Negative.   Eyes: Negative.   Respiratory: Negative.   Cardiovascular: Negative.   Gastrointestinal: Negative.   Genitourinary: Negative.   Musculoskeletal: Negative.   Skin: Negative.   Neurological: Negative.   Hematological: Negative.   Psychiatric/Behavioral: Negative.        Objective:   Physical Exam  Constitutional: He is oriented to person, place, and time. He appears well-developed and well-nourished. No distress.  HENT:  Head: Normocephalic and atraumatic.  Right Ear: External ear normal.  Left Ear: External ear normal.  Nose: Nose normal.  Mouth/Throat: Oropharynx is clear and moist. No oropharyngeal exudate.  Eyes: Conjunctivae and EOM are normal. Pupils are equal, round, and reactive to light. Right eye exhibits no discharge. Left eye exhibits no discharge. No scleral icterus.  Neck: Neck supple. No JVD present. No tracheal deviation present. No thyromegaly present.  Cardiovascular: Normal rate, regular rhythm, normal heart sounds and intact distal pulses.  Exam reveals no gallop and no friction rub.   No murmur heard.      EKG normal   Pulmonary/Chest: Effort normal and breath sounds normal. No respiratory distress. He has no wheezes. He has no rales. He exhibits no tenderness.  Abdominal: Soft. Bowel sounds are normal. He exhibits no distension and no mass. There is no tenderness. There is no rebound and no guarding.  Genitourinary: Rectum normal, prostate normal and penis normal. Guaiac negative stool. No penile tenderness.  Musculoskeletal: Normal range of motion. He exhibits no edema and no tenderness.    Lymphadenopathy:    He has no cervical adenopathy.  Neurological: He is alert and oriented to person, place, and time. He has normal reflexes. No cranial nerve deficit. He exhibits normal muscle tone. Coordination normal.  Skin: Skin is warm and dry. No rash noted. He is not diaphoretic. No erythema. No pallor.  Psychiatric: He has a normal mood and affect. His behavior is normal. Judgment and thought content normal.          Assessment & Plan:  Get back on Crestor. Exercise regularly

## 2010-12-28 ENCOUNTER — Encounter: Payer: Self-pay | Admitting: Family Medicine

## 2011-07-09 ENCOUNTER — Encounter: Payer: Self-pay | Admitting: Internal Medicine

## 2011-07-16 ENCOUNTER — Encounter: Payer: Self-pay | Admitting: Internal Medicine

## 2011-07-24 ENCOUNTER — Ambulatory Visit (AMBULATORY_SURGERY_CENTER): Payer: Medicare Other

## 2011-07-24 VITALS — Ht 65.0 in | Wt 154.6 lb

## 2011-07-24 DIAGNOSIS — Z1211 Encounter for screening for malignant neoplasm of colon: Secondary | ICD-10-CM | POA: Diagnosis not present

## 2011-07-24 DIAGNOSIS — Z86010 Personal history of colonic polyps: Secondary | ICD-10-CM

## 2011-07-24 MED ORDER — PEG-KCL-NACL-NASULF-NA ASC-C 100 G PO SOLR: 1.0000 | Freq: Once | ORAL | Status: AC

## 2011-08-07 ENCOUNTER — Ambulatory Visit (AMBULATORY_SURGERY_CENTER): Payer: Medicare Other | Admitting: Internal Medicine

## 2011-08-07 ENCOUNTER — Encounter: Payer: Self-pay | Admitting: Internal Medicine

## 2011-08-07 VITALS — BP 137/80 | HR 68 | Temp 97.2°F | Resp 16

## 2011-08-07 DIAGNOSIS — Z86010 Personal history of colonic polyps: Secondary | ICD-10-CM | POA: Diagnosis not present

## 2011-08-07 DIAGNOSIS — Z1211 Encounter for screening for malignant neoplasm of colon: Secondary | ICD-10-CM | POA: Diagnosis not present

## 2011-08-07 DIAGNOSIS — F411 Generalized anxiety disorder: Secondary | ICD-10-CM | POA: Diagnosis not present

## 2011-08-07 DIAGNOSIS — D126 Benign neoplasm of colon, unspecified: Secondary | ICD-10-CM | POA: Diagnosis not present

## 2011-08-07 MED ORDER — SODIUM CHLORIDE 0.9 % IV SOLN: 500.0000 mL | INTRAVENOUS | Status: DC

## 2011-08-07 NOTE — Patient Instructions (Signed)
Please read the handouts given to you by your recovery room nurse.   Your polyp results will be mailed to you within two weeks.   You may resume your routine medications today.   If you have any questions, call us at [phone]. Thank-you.

## 2011-08-07 NOTE — Op Note (Signed)
[facility] [address] [place], [place]  [address]  COLONOSCOPY PROCEDURE REPORT  PATIENT:  [name], Mason  MR#:  [id number] BIRTHDATE:  [birth year], 70 yrs. old  GENDER:  male ENDOSCOPIST:  Wilhemina Bonito. Eda Keys, MD REF. BY:  Surveillance Program Recall, PROCEDURE DATE:  08/07/2011 PROCEDURE:  Colonoscopy with snare polypectomy x 1 ASA CLASS:  Class II INDICATIONS:  history of pre-cancerous (adenomatous) colon polyps, surveillance and high-risk screening ; prior exams 2002,2004,2008 w/ TAs MEDICATIONS:   MAC sedation, administered by CRNA, propofol (Diprivan) 350 mg IV  DESCRIPTION OF PROCEDURE:   After the risks benefits and alternatives of the procedure were thoroughly explained, informed consent was obtained.  Digital rectal exam was performed and revealed no abnormalities.   The LB CF-H180AL [id number] endoscope was introduced through the anus and advanced to the cecum, which was identified by both the appendix and ileocecal valve, without limitations.  The quality of the prep was excellent, using MoviPrep.  The instrument was then slowly withdrawn as the colon was fully examined. <<PROCEDUREIMAGES>>  FINDINGS:  A diminutive polyp was found in the ascending colon and snared without cautery. Retrieval was successful.   Mild diverticulosis was found in the sigmoid colon.  Otherwise normal colonoscopy without other polyps, masses, vascular ectasias, or inflammatory changes.   Retroflexed views in the rectum revealed internal hemorrhoids.    The time to cecum = 5:42  minutes. The scope was then withdrawn in 10:28  minutes from the cecum and the procedure completed.  COMPLICATIONS:  None  ENDOSCOPIC IMPRESSION: 1) Diminutive polyp in the ascending colon - removed 2) Mild diverticulosis in the sigmoid colon 3) Otherwise normal colonoscopy  RECOMMENDATIONS: 1) Follow up colonoscopy in 5 years  ______________________________ Wilhemina Bonito. Eda Keys, MD  CC:  Nelwyn Salisbury, MD;  The Patient  n. eSIGNED:   Wilhemina Bonito. Eda Keys at 08/07/2011 11:19 AM  Terrance Mass, [phone]

## 2011-08-07 NOTE — Progress Notes (Signed)
Patient did not have preoperative order for IV antibiotic SSI prophylaxis. (G8918)  Patient did not experience any of the following events: a burn prior to discharge; a fall within the facility; wrong site/side/patient/procedure/implant event; or a hospital transfer or hospital admission upon discharge from the facility. (G8907)  

## 2011-08-08 ENCOUNTER — Telehealth: Payer: Self-pay | Admitting: *Deleted

## 2011-08-08 NOTE — Telephone Encounter (Signed)
  Follow up Call-  Call back number 08/07/2011  Post procedure Call Back phone  # [phone] hm  Permission to leave phone message Yes     Patient questions:  Do you have a fever, pain , or abdominal swelling? no Pain Score  0 *  Have you tolerated food without any problems? yes  Have you been able to return to your normal activities? yes  Do you have any questions about your discharge instructions: Diet   no Medications  no Follow up visit  no  Do you have questions or concerns about your Care? no  Actions: * If pain score is 4 or above: No action needed, pain <4.

## 2011-08-14 ENCOUNTER — Encounter: Payer: Self-pay | Admitting: Internal Medicine

## 2011-08-21 DIAGNOSIS — H2510 Age-related nuclear cataract, unspecified eye: Secondary | ICD-10-CM | POA: Diagnosis not present

## 2011-08-28 ENCOUNTER — Ambulatory Visit (INDEPENDENT_AMBULATORY_CARE_PROVIDER_SITE_OTHER): Payer: Medicare Other | Admitting: Family Medicine

## 2011-08-28 ENCOUNTER — Encounter: Payer: Self-pay | Admitting: Family Medicine

## 2011-08-28 ENCOUNTER — Telehealth: Payer: Self-pay | Admitting: Internal Medicine

## 2011-08-28 DIAGNOSIS — R109 Unspecified abdominal pain: Secondary | ICD-10-CM | POA: Diagnosis not present

## 2011-08-28 DIAGNOSIS — R350 Frequency of micturition: Secondary | ICD-10-CM

## 2011-08-28 LAB — POCT URINALYSIS DIPSTICK
Glucose, UA: NEGATIVE
Spec Grav, UA: 1.02
Urobilinogen, UA: 0.2

## 2011-08-28 NOTE — Telephone Encounter (Signed)
Not clear to me why he has minor groin pain. Unlikely related to his procedure. He can come in and see an extender (or his PCP - which ever he prefers) for evaluation. Thanks

## 2011-08-28 NOTE — Telephone Encounter (Signed)
Pt aware and states he will contact his PCP.

## 2011-08-28 NOTE — Telephone Encounter (Signed)
Pt had a colon with Dr. Marina Goodell 08/07/11. Pt states he has had some discomfort in his left lower quadrant since the procedure. States he has had no fever and it is more like a cramping sensation than pain. Only rates it at a 1 or 2 on the pain scale. He has seen no blood in the stool but has had a lot of gas. Dr. Marina Goodell please advise.

## 2011-08-28 NOTE — Progress Notes (Signed)
  Subjective:    Patient ID: Mason [name], male    DOB: December 12, 1940, 71 y.o.   MRN: [id number]  HPI Here for intermittent mild pains throughout the abdomen for the past few weeks. These can be in the LUQ or the RUQ or the LLQ. His BMs are regular, no fever or nausea. No urinary symptoms. These started after he had a colonoscopy on 08-07-11. He had a single benign polyp.   Review of Systems  Constitutional: Negative.   Respiratory: Negative.   Cardiovascular: Negative.   Gastrointestinal: Positive for abdominal pain. Negative for nausea, vomiting, diarrhea, constipation, blood in stool, abdominal distention and rectal pain.  Genitourinary: Negative.        Objective:   Physical Exam  Constitutional: He appears well-developed and well-nourished.  Abdominal: Soft. Bowel sounds are normal. He exhibits no distension and no mass. There is no tenderness. There is no rebound and no guarding.  Genitourinary: Rectum normal and prostate normal.       Testicles normal          Assessment & Plan:  This is probably some retained gas from the colonoscopy. This should resolve in the next week or two. Recheck prn

## 2011-09-05 DIAGNOSIS — M719 Bursopathy, unspecified: Secondary | ICD-10-CM | POA: Diagnosis not present

## 2011-09-05 DIAGNOSIS — M67919 Unspecified disorder of synovium and tendon, unspecified shoulder: Secondary | ICD-10-CM | POA: Diagnosis not present

## 2011-09-11 DIAGNOSIS — R972 Elevated prostate specific antigen [PSA]: Secondary | ICD-10-CM | POA: Diagnosis not present

## 2011-11-23 ENCOUNTER — Other Ambulatory Visit: Payer: Self-pay | Admitting: Family Medicine

## 2012-01-23 ENCOUNTER — Ambulatory Visit (INDEPENDENT_AMBULATORY_CARE_PROVIDER_SITE_OTHER): Payer: Medicare Other | Admitting: Family Medicine

## 2012-01-23 ENCOUNTER — Encounter: Payer: Self-pay | Admitting: Family Medicine

## 2012-01-23 VITALS — BP 130/84 | HR 63 | Temp 97.9°F | Ht 63.5 in | Wt 156.0 lb

## 2012-01-23 DIAGNOSIS — I10 Essential (primary) hypertension: Secondary | ICD-10-CM

## 2012-01-23 DIAGNOSIS — N138 Other obstructive and reflux uropathy: Secondary | ICD-10-CM

## 2012-01-23 DIAGNOSIS — E785 Hyperlipidemia, unspecified: Secondary | ICD-10-CM

## 2012-01-23 DIAGNOSIS — N401 Enlarged prostate with lower urinary tract symptoms: Secondary | ICD-10-CM

## 2012-01-23 DIAGNOSIS — E039 Hypothyroidism, unspecified: Secondary | ICD-10-CM

## 2012-01-23 DIAGNOSIS — N139 Obstructive and reflux uropathy, unspecified: Secondary | ICD-10-CM | POA: Diagnosis not present

## 2012-01-23 LAB — POCT URINALYSIS DIPSTICK
Ketones, UA: NEGATIVE
Leukocytes, UA: NEGATIVE
Nitrite, UA: NEGATIVE
Protein, UA: NEGATIVE
Urobilinogen, UA: 0.2
pH, UA: 6.5

## 2012-01-23 LAB — CBC WITH DIFFERENTIAL/PLATELET
Basophils Absolute: 0 10*3/uL (ref 0.0–0.1)
Eosinophils Absolute: 0.1 10*3/uL (ref 0.0–0.7)
Lymphocytes Relative: 30.3 % (ref 12.0–46.0)
MCHC: 34.1 g/dL (ref 30.0–36.0)
Monocytes Relative: 6.8 % (ref 3.0–12.0)
Neutro Abs: 3.8 10*3/uL (ref 1.4–7.7)
Neutrophils Relative %: 60.2 % (ref 43.0–77.0)
Platelets: 173 10*3/uL (ref 150.0–400.0)
RDW: 12.8 % (ref 11.5–14.6)

## 2012-01-23 LAB — HEPATIC FUNCTION PANEL
ALT: 20 U/L (ref 0–53)
AST: 19 U/L (ref 0–37)
Albumin: 4.4 g/dL (ref 3.5–5.2)
Total Bilirubin: 1.3 mg/dL — ABNORMAL HIGH (ref 0.3–1.2)
Total Protein: 6.9 g/dL (ref 6.0–8.3)

## 2012-01-23 LAB — LIPID PANEL
Cholesterol: 135 mg/dL (ref 0–200)
LDL Cholesterol: 68 mg/dL (ref 0–99)
Triglycerides: 73 mg/dL (ref 0.0–149.0)
VLDL: 14.6 mg/dL (ref 0.0–40.0)

## 2012-01-23 LAB — BASIC METABOLIC PANEL
BUN: 20 mg/dL (ref 6–23)
Chloride: 108 mEq/L (ref 96–112)
Creatinine, Ser: 1.1 mg/dL (ref 0.4–1.5)
Glucose, Bld: 88 mg/dL (ref 70–99)

## 2012-01-23 LAB — TSH: TSH: 1.8 u[IU]/mL (ref 0.35–5.50)

## 2012-01-23 MED ORDER — LEVOTHYROXINE SODIUM 75 MCG PO TABS: 75.0000 ug | ORAL_TABLET | Freq: Every day | ORAL | Status: DC

## 2012-01-23 MED ORDER — ROSUVASTATIN CALCIUM 10 MG PO TABS: 10.0000 mg | ORAL_TABLET | Freq: Every day | ORAL | Status: DC

## 2012-01-23 MED ORDER — LOSARTAN POTASSIUM 100 MG PO TABS: 100.0000 mg | ORAL_TABLET | Freq: Every day | ORAL | Status: DC

## 2012-01-23 NOTE — Progress Notes (Signed)
  Subjective:    Patient ID: Mason [name], male    DOB: 1940/08/12, 71 y.o.   MRN: [id number]  HPI 71 yr old male for a cpx. He feels fine and has no concerns. He saw Dr. Lowella Curb last spring for his annual prostate exam and PSA, which remains elevated but stable in the range of 6 to 7. His BP is well controlled.    Review of Systems  Constitutional: Negative.   HENT: Negative.   Eyes: Negative.   Respiratory: Negative.   Cardiovascular: Negative.   Gastrointestinal: Negative.   Genitourinary: Negative.   Musculoskeletal: Negative.   Skin: Negative.   Neurological: Negative.   Hematological: Negative.   Psychiatric/Behavioral: Negative.        Objective:   Physical Exam  Constitutional: He is oriented to person, place, and time. He appears well-developed and well-nourished. No distress.  HENT:  Head: Normocephalic and atraumatic.  Right Ear: External ear normal.  Left Ear: External ear normal.  Nose: Nose normal.  Mouth/Throat: Oropharynx is clear and moist. No oropharyngeal exudate.  Eyes: Conjunctivae and EOM are normal. Pupils are equal, round, and reactive to light. Right eye exhibits no discharge. Left eye exhibits no discharge. No scleral icterus.  Neck: Neck supple. No JVD present. No tracheal deviation present. No thyromegaly present.  Cardiovascular: Normal rate, regular rhythm, normal heart sounds and intact distal pulses.  Exam reveals no gallop and no friction rub.   No murmur heard.      EKG normal   Pulmonary/Chest: Effort normal and breath sounds normal. No respiratory distress. He has no wheezes. He has no rales. He exhibits no tenderness.  Abdominal: Soft. Bowel sounds are normal. He exhibits no distension and no mass. There is no tenderness. There is no rebound and no guarding.  Genitourinary: Rectum normal, prostate normal and penis normal. Guaiac negative stool. No penile tenderness.  Musculoskeletal: Normal range of motion. He exhibits no edema and no  tenderness.  Lymphadenopathy:    He has no cervical adenopathy.  Neurological: He is alert and oriented to person, place, and time. He has normal reflexes. No cranial nerve deficit. He exhibits normal muscle tone. Coordination normal.  Skin: Skin is warm and dry. No rash noted. He is not diaphoretic. No erythema. No pallor.  Psychiatric: He has a normal mood and affect. His behavior is normal. Judgment and thought content normal.          Assessment & Plan:  Well exam. Get fasting labs.

## 2012-01-28 NOTE — Progress Notes (Signed)
Quick Note:  I left voice message with normal results. ______ 

## 2012-04-29 ENCOUNTER — Ambulatory Visit (INDEPENDENT_AMBULATORY_CARE_PROVIDER_SITE_OTHER): Payer: Medicare Other

## 2012-04-29 DIAGNOSIS — Z23 Encounter for immunization: Secondary | ICD-10-CM

## 2012-05-01 DIAGNOSIS — R972 Elevated prostate specific antigen [PSA]: Secondary | ICD-10-CM | POA: Diagnosis not present

## 2012-07-10 ENCOUNTER — Encounter: Payer: Self-pay | Admitting: Family Medicine

## 2012-07-10 ENCOUNTER — Ambulatory Visit (INDEPENDENT_AMBULATORY_CARE_PROVIDER_SITE_OTHER): Payer: Medicare Other | Admitting: Family Medicine

## 2012-07-10 VITALS — BP 120/80 | HR 64 | Temp 98.0°F | Wt 150.0 lb

## 2012-07-10 DIAGNOSIS — M1990 Unspecified osteoarthritis, unspecified site: Secondary | ICD-10-CM

## 2012-07-10 NOTE — Progress Notes (Signed)
  Subjective:    Patient ID: Mason [name], male    DOB: Aug 05, 1940, 72 y.o.   MRN: [id number]  HPI Here to address some dull achy pains in the right hip and anterior thigh that started several months ago. No trauma. He remains active, he plays golf and he walks 3-4 miles daily. He takes some occasional Advil for this. He has mild pains in multiple joints.    Review of Systems  Constitutional: Negative.   Musculoskeletal: Positive for arthralgias. Negative for myalgias, back pain, joint swelling and gait problem.       Objective:   Physical Exam  Constitutional: He appears well-developed and well-nourished.  Musculoskeletal:       The right hip has full ROM but he has some pain on internal and external rotation. Not tender           Assessment & Plan:  Degenerative arthritis. Start on Osteo-Biflex daily. Use Advil prn.

## 2012-08-27 ENCOUNTER — Ambulatory Visit (INDEPENDENT_AMBULATORY_CARE_PROVIDER_SITE_OTHER): Payer: Medicare Other | Admitting: Family Medicine

## 2012-08-27 ENCOUNTER — Encounter: Payer: Self-pay | Admitting: Family Medicine

## 2012-08-27 VITALS — BP 130/82 | HR 72 | Temp 97.9°F | Wt 152.0 lb

## 2012-08-27 DIAGNOSIS — R079 Chest pain, unspecified: Secondary | ICD-10-CM | POA: Diagnosis not present

## 2012-08-27 DIAGNOSIS — Z23 Encounter for immunization: Secondary | ICD-10-CM

## 2012-08-27 DIAGNOSIS — R0602 Shortness of breath: Secondary | ICD-10-CM | POA: Diagnosis not present

## 2012-08-27 MED ORDER — ASPIRIN 81 MG PO TBEC: 81.0000 mg | DELAYED_RELEASE_TABLET | Freq: Every day | ORAL | Status: DC

## 2012-08-27 NOTE — Addendum Note (Signed)
Addended by: Aniceto Boss A on: 08/27/2012 09:39 AM   Modules accepted: Orders

## 2012-08-27 NOTE — Progress Notes (Signed)
  Subjective:    Patient ID: Mason [name], male    DOB: [birth year], 72 y.o.   MRN: [id number]  HPI Here with concerns about possible heart problems. He has had 2 stress tests over the years, the last of which was in 2009. These were normal. However over the past 2 months he has had increasing episodes of chest pains, chest tightness, and SOB. These are brief, they may occur at rest or during exertion. He is worried because of a strong family hx of heart disease.    Review of Systems  Constitutional: Negative.   Respiratory: Positive for chest tightness and shortness of breath. Negative for apnea, cough and wheezing.   Cardiovascular: Positive for chest pain. Negative for palpitations and leg swelling.       Objective:   Physical Exam  Constitutional: He appears well-developed and well-nourished. No distress.  Neck: No thyromegaly present.  Cardiovascular: Normal rate, regular rhythm, normal heart sounds and intact distal pulses.   Pulmonary/Chest: Effort normal and breath sounds normal.  Lymphadenopathy:    He has no cervical adenopathy.          Assessment & Plan:  This is not a typical angina pattern but we need to rule out ischemia. He will start taking aspirin 81 mg daily. Set up a stress test soon.

## 2012-09-02 DIAGNOSIS — D3130 Benign neoplasm of unspecified choroid: Secondary | ICD-10-CM | POA: Diagnosis not present

## 2012-09-04 ENCOUNTER — Ambulatory Visit (HOSPITAL_COMMUNITY): Payer: Medicare Other | Attending: Cardiology | Admitting: Radiology

## 2012-09-04 VITALS — BP 114/76 | Ht 65.0 in | Wt 152.0 lb

## 2012-09-04 DIAGNOSIS — R079 Chest pain, unspecified: Secondary | ICD-10-CM | POA: Diagnosis not present

## 2012-09-04 DIAGNOSIS — R0602 Shortness of breath: Secondary | ICD-10-CM

## 2012-09-04 DIAGNOSIS — IMO0001 Reserved for inherently not codable concepts without codable children: Secondary | ICD-10-CM

## 2012-09-04 HISTORY — DX: Reserved for inherently not codable concepts without codable children: IMO0001

## 2012-09-04 MED ORDER — TECHNETIUM TC 99M SESTAMIBI GENERIC - CARDIOLITE
33.0000 | Freq: Once | INTRAVENOUS | Status: AC | PRN
  Administered 2012-09-04: 33 via INTRAVENOUS

## 2012-09-04 MED ORDER — TECHNETIUM TC 99M SESTAMIBI GENERIC - CARDIOLITE
11.0000 | Freq: Once | INTRAVENOUS | Status: AC | PRN
  Administered 2012-09-04: 11 via INTRAVENOUS

## 2012-09-04 NOTE — Progress Notes (Signed)
[facility] SITE 3 NUCLEAR MED [address] [place], [place] [address] [phone]    Cardiology Nuclear Med Study  Mason [name] is a 72 y.o. male     MRN : [id number]     DOB: 03-29-1941  Procedure Date: 09/04/2012  Nuclear Med Background Indication for Stress Test:  Evaluation for Ischemia History:  2009 MPS EF 56%- Normal Cardiac Risk Factors: Family History - CAD, History of Smoking, Hypertension and Lipids  Symptoms:  Chest Pain and DOE   Nuclear Pre-Procedure Caffeine/Decaff Intake:  None NPO After: 7:30pm   Lungs:  clear O2 Sat: 97% on room air. IV 0.9% NS with Angio Cath:  20g  IV Site: R Hand  IV Started by:  Bonnita Levan, RN  Chest Size (in):  42 Cup Size: n/a  Height: 5\' 5"  (1.651 m)  Weight:  152 lb (68.947 kg)  BMI:  Body mass index is 25.29 kg/(m^2). Tech Comments:  N/A    Nuclear Med Study 1 or 2 day study: 1 day  Stress Test Type:  Stress  Reading MD: Marca Ancona, MD  Order Authorizing Provider:  S.Clent Ridges, MD  Resting Radionuclide: Technetium 52m Sestamibi  Resting Radionuclide Dose: 11.0 mCi   Stress Radionuclide:  Technetium 62m Sestamibi  Stress Radionuclide Dose: 33.0 mCi           Stress Protocol Rest HR: 53 Stress HR: 144  Rest BP: 114/76 Stress BP: 174/94  Exercise Time (min): 9:48 METS: 11.4   Predicted Max HR: 151 bpm % Max HR: 95.36 bpm Rate Pressure Product: 46962   Dose of Adenosine (mg):  n/a Dose of Lexiscan: n/a mg  Dose of Atropine (mg): n/a Dose of Dobutamine: n/a mcg/kg/min (at max HR)  Stress Test Technologist: Bonnita Levan, RN  Nuclear Technologist:  Domenic Polite, CNMT     Rest Procedure:  Myocardial perfusion imaging was performed at rest 45 minutes following the intravenous administration of Technetium 84m Sestamibi. Rest ECG: NSR - Normal EKG  Stress Procedure:  The patient exercised on the treadmill utilizing the Bruce Protocol for 9:48 minutes. The patient stopped due to dyspnea and fatigue and denied  any chest pain.  Technetium 26m Sestamibi was injected at peak exercise and myocardial perfusion imaging was performed after a brief delay. Stress ECG: 2 mm horizontal ST depression in V4 and V5 that resolved quickly in recovery.   QPS Raw Data Images:  Normal; no motion artifact; normal heart/lung ratio. Stress Images:  Small, mild basal to mid inferior perfusion defect.  Rest Images:  Small, mild basal to mid inferior perfusion defect.  Subtraction (SDS):  Fixed small, mild basal to mid inferior perfusion defect.  Transient Ischemic Dilatation (Normal <1.22):  1.07 Lung/Heart Ratio (Normal <0.45):  0.27  Quantitative Gated Spect Images QGS EDV:  102 ml QGS ESV:  49 ml  Impression Exercise Capacity:  Good exercise capacity. BP Response:  Hypotensive blood pressure response. Clinical Symptoms:  Dyspnea ECG Impression:  2 mm horizontal ST depression in V4 and V5 that resolved quickly in recovery.  Comparison with Prior Nuclear Study: No images to compare  Overall Impression:  Low risk stress nuclear study.  There was a fixed, small mild basal to mid inferior perfusion defect with normal inferior wall motion.  This suggests that the defect is most likely diaphragmatic attenuation.  No evidence for ischemia.  The patient did have ST depression with exercise ECG, though changes resolved quickly in recovery.  In addition, BP fell at peak  stress significantly (though could be a measuring error).    LV Ejection Fraction: 52%.  LV Wall Motion:  Normal Wall Motion.  Low normal overall systolic function.   Marca Ancona 09/04/2012

## 2012-09-09 NOTE — Progress Notes (Signed)
Quick Note:  Released results in chart. ______

## 2012-11-18 ENCOUNTER — Telehealth: Payer: Self-pay | Admitting: Family Medicine

## 2012-11-18 MED ORDER — LEVOTHYROXINE SODIUM 75 MCG PO TABS: 75.0000 ug | ORAL_TABLET | Freq: Every day | ORAL | Status: DC

## 2012-11-18 NOTE — Telephone Encounter (Signed)
Refill request for Levothyroxin 75 mcg and can pharmacy switch from [facility] to [facility]?

## 2012-11-18 NOTE — Telephone Encounter (Signed)
I did send script e-scribe.  

## 2012-11-18 NOTE — Telephone Encounter (Signed)
Yes that is okay  

## 2013-01-20 ENCOUNTER — Other Ambulatory Visit: Payer: Self-pay | Admitting: Family Medicine

## 2013-01-27 DIAGNOSIS — R972 Elevated prostate specific antigen [PSA]: Secondary | ICD-10-CM | POA: Diagnosis not present

## 2013-01-27 DIAGNOSIS — R823 Hemoglobinuria: Secondary | ICD-10-CM | POA: Diagnosis not present

## 2013-02-16 ENCOUNTER — Ambulatory Visit (INDEPENDENT_AMBULATORY_CARE_PROVIDER_SITE_OTHER): Payer: Medicare Other | Admitting: Family Medicine

## 2013-02-16 ENCOUNTER — Encounter: Payer: Self-pay | Admitting: Family Medicine

## 2013-02-16 VITALS — BP 136/82 | HR 56 | Temp 97.5°F | Ht 64.0 in | Wt 155.0 lb

## 2013-02-16 DIAGNOSIS — Z0000 Encounter for general adult medical examination without abnormal findings: Secondary | ICD-10-CM

## 2013-02-16 DIAGNOSIS — E785 Hyperlipidemia, unspecified: Secondary | ICD-10-CM

## 2013-02-16 DIAGNOSIS — M1990 Unspecified osteoarthritis, unspecified site: Secondary | ICD-10-CM | POA: Diagnosis not present

## 2013-02-16 DIAGNOSIS — N400 Enlarged prostate without lower urinary tract symptoms: Secondary | ICD-10-CM

## 2013-02-16 DIAGNOSIS — K219 Gastro-esophageal reflux disease without esophagitis: Secondary | ICD-10-CM

## 2013-02-16 DIAGNOSIS — I10 Essential (primary) hypertension: Secondary | ICD-10-CM | POA: Diagnosis not present

## 2013-02-16 LAB — TSH: TSH: 2.22 u[IU]/mL (ref 0.35–5.50)

## 2013-02-16 LAB — CBC WITH DIFFERENTIAL/PLATELET
Basophils Relative: 0.8 % (ref 0.0–3.0)
Eosinophils Relative: 2.8 % (ref 0.0–5.0)
HCT: 45 % (ref 39.0–52.0)
Lymphs Abs: 1.7 10*3/uL (ref 0.7–4.0)
MCV: 92.5 fl (ref 78.0–100.0)
Monocytes Absolute: 0.4 10*3/uL (ref 0.1–1.0)
Monocytes Relative: 7.3 % (ref 3.0–12.0)
Neutrophils Relative %: 59.1 % (ref 43.0–77.0)
Platelets: 186 10*3/uL (ref 150.0–400.0)
RBC: 4.87 Mil/uL (ref 4.22–5.81)
WBC: 5.7 10*3/uL (ref 4.5–10.5)

## 2013-02-16 LAB — LIPID PANEL
LDL Cholesterol: 74 mg/dL (ref 0–99)
Total CHOL/HDL Ratio: 3
Triglycerides: 59 mg/dL (ref 0.0–149.0)

## 2013-02-16 LAB — POCT URINALYSIS DIPSTICK
Bilirubin, UA: NEGATIVE
Blood, UA: NEGATIVE
Glucose, UA: NEGATIVE
Spec Grav, UA: <=1.005
Urobilinogen, UA: 0.2

## 2013-02-16 LAB — HEPATIC FUNCTION PANEL
Alkaline Phosphatase: 52 U/L (ref 39–117)
Bilirubin, Direct: 0.1 mg/dL (ref 0.0–0.3)
Total Bilirubin: 0.9 mg/dL (ref 0.3–1.2)

## 2013-02-16 LAB — BASIC METABOLIC PANEL
CO2: 27 mEq/L (ref 19–32)
Chloride: 107 mEq/L (ref 96–112)
Creatinine, Ser: 1 mg/dL (ref 0.4–1.5)
Potassium: 3.8 mEq/L (ref 3.5–5.1)
Sodium: 139 mEq/L (ref 135–145)

## 2013-02-16 MED ORDER — LOSARTAN POTASSIUM 100 MG PO TABS: 100.0000 mg | ORAL_TABLET | Freq: Every day | ORAL | Status: DC

## 2013-02-16 MED ORDER — LEVOTHYROXINE SODIUM 75 MCG PO TABS: 75.0000 ug | ORAL_TABLET | Freq: Every day | ORAL | Status: DC

## 2013-02-16 MED ORDER — ROSUVASTATIN CALCIUM 10 MG PO TABS: 10.0000 mg | ORAL_TABLET | Freq: Every day | ORAL | Status: DC

## 2013-02-16 NOTE — Progress Notes (Signed)
  Subjective:    Patient ID: Mason [name], male    DOB: 02/27/1941, 72 y.o.   MRN: [id number]  HPI 72 yr old male for a cpx. He feels well and has no concerns. He has had no further chest pains since last spring. He saw his Urologist 3 weeks ago and his PSA had declined from 11 to 8.    Review of Systems  Constitutional: Negative.   HENT: Negative.   Eyes: Negative.   Respiratory: Negative.   Cardiovascular: Negative.   Gastrointestinal: Negative.   Genitourinary: Negative.   Musculoskeletal: Negative.   Skin: Negative.   Neurological: Negative.   Psychiatric/Behavioral: Negative.        Objective:   Physical Exam  Constitutional: He is oriented to person, place, and time. He appears well-developed and well-nourished. No distress.  HENT:  Head: Normocephalic and atraumatic.  Right Ear: External ear normal.  Left Ear: External ear normal.  Nose: Nose normal.  Mouth/Throat: Oropharynx is clear and moist. No oropharyngeal exudate.  Eyes: Conjunctivae and EOM are normal. Pupils are equal, round, and reactive to light. Right eye exhibits no discharge. Left eye exhibits no discharge. No scleral icterus.  Neck: Neck supple. No JVD present. No tracheal deviation present. No thyromegaly present.  Cardiovascular: Normal rate, regular rhythm, normal heart sounds and intact distal pulses.  Exam reveals no gallop and no friction rub.   No murmur heard. EKG normal   Pulmonary/Chest: Effort normal and breath sounds normal. No respiratory distress. He has no wheezes. He has no rales. He exhibits no tenderness.  Abdominal: Soft. Bowel sounds are normal. He exhibits no distension and no mass. There is no tenderness. There is no rebound and no guarding.  Musculoskeletal: Normal range of motion. He exhibits no edema and no tenderness.  Lymphadenopathy:    He has no cervical adenopathy.  Neurological: He is alert and oriented to person, place, and time. He has normal reflexes. No cranial  nerve deficit. He exhibits normal muscle tone. Coordination normal.  Skin: Skin is warm and dry. No rash noted. He is not diaphoretic. No erythema. No pallor.  Psychiatric: He has a normal mood and affect. His behavior is normal. Judgment and thought content normal.          Assessment & Plan:  Well exam. Get fasting labs

## 2013-02-18 NOTE — Progress Notes (Signed)
Quick Note:  I released results in my chart. ______ 

## 2013-04-01 ENCOUNTER — Ambulatory Visit (INDEPENDENT_AMBULATORY_CARE_PROVIDER_SITE_OTHER): Payer: Medicare Other

## 2013-04-01 DIAGNOSIS — Z23 Encounter for immunization: Secondary | ICD-10-CM | POA: Diagnosis not present

## 2013-05-26 DIAGNOSIS — R238 Other skin changes: Secondary | ICD-10-CM | POA: Diagnosis not present

## 2013-05-26 DIAGNOSIS — D485 Neoplasm of uncertain behavior of skin: Secondary | ICD-10-CM | POA: Diagnosis not present

## 2013-05-26 DIAGNOSIS — L570 Actinic keratosis: Secondary | ICD-10-CM | POA: Diagnosis not present

## 2013-09-16 DIAGNOSIS — D3130 Benign neoplasm of unspecified choroid: Secondary | ICD-10-CM | POA: Diagnosis not present

## 2013-12-08 DIAGNOSIS — N453 Epididymo-orchitis: Secondary | ICD-10-CM | POA: Diagnosis not present

## 2013-12-08 DIAGNOSIS — R823 Hemoglobinuria: Secondary | ICD-10-CM | POA: Diagnosis not present

## 2013-12-08 DIAGNOSIS — R972 Elevated prostate specific antigen [PSA]: Secondary | ICD-10-CM | POA: Diagnosis not present

## 2013-12-14 DIAGNOSIS — N453 Epididymo-orchitis: Secondary | ICD-10-CM | POA: Diagnosis not present

## 2014-01-11 DIAGNOSIS — N453 Epididymo-orchitis: Secondary | ICD-10-CM | POA: Diagnosis not present

## 2014-01-13 ENCOUNTER — Encounter: Payer: Self-pay | Admitting: Family Medicine

## 2014-01-13 ENCOUNTER — Ambulatory Visit (INDEPENDENT_AMBULATORY_CARE_PROVIDER_SITE_OTHER): Payer: Medicare Other | Admitting: Family Medicine

## 2014-01-13 ENCOUNTER — Telehealth: Payer: Self-pay | Admitting: Family Medicine

## 2014-01-13 VITALS — BP 140/83 | HR 62 | Temp 98.8°F | Ht 64.0 in | Wt 154.0 lb

## 2014-01-13 DIAGNOSIS — I10 Essential (primary) hypertension: Secondary | ICD-10-CM | POA: Diagnosis not present

## 2014-01-13 DIAGNOSIS — R109 Unspecified abdominal pain: Secondary | ICD-10-CM

## 2014-01-13 MED ORDER — LOSARTAN POTASSIUM-HCTZ 100-25 MG PO TABS: 1.0000 | ORAL_TABLET | Freq: Every day | ORAL | Status: DC

## 2014-01-13 NOTE — Telephone Encounter (Signed)
Patient Information:  Caller Name: Warnell  Phone: [phone]  Patient: Mason [name], Mason [name]  Gender: Male  DOB: 11/14/1940  Age: 73 Years  PCP: Alysia Penna [facility])  Office Follow Up:  Does the office need to follow up with this patient?: No  Instructions For The Office: N/A  RN Note:  Patient calling regarding increase in B/P over the past few days.  BP ranges from 140/100 to 163/86.  Plans to go out of town and requesting evaluation prior to leaving.  Symptoms  Reason For Call & Symptoms: elevated blood pressure  Reviewed Health History In EMR: Yes  Reviewed Medications In EMR: Yes  Reviewed Allergies In EMR: Yes  Reviewed Surgeries / Procedures: Yes  Date of Onset of Symptoms: 01/10/2014  Guideline(s) Used:  High Blood Pressure  Disposition Per Guideline:   See Today in Office  Reason For Disposition Reached:   Patient wants to be seen  Advice Given:  Call Back If:  You become worse.  Patient Will Follow Care Advice:  YES  Appointment Scheduled:  01/13/2014 11:15:00 Appointment Scheduled Provider:  Alysia Penna [facility])

## 2014-01-13 NOTE — Telephone Encounter (Signed)
Relevant patient education assigned to patient using Emmi. ° °

## 2014-01-13 NOTE — Progress Notes (Signed)
Pre visit review using our clinic review tool, if applicable. No additional management support is needed unless otherwise documented below in the visit note. 

## 2014-01-13 NOTE — Telephone Encounter (Signed)
Noted  

## 2014-01-13 NOTE — Progress Notes (Signed)
   Subjective:    Patient ID: Mason [name], male    DOB: 03-Sep-1940, 73 y.o.   MRN: [id number]  HPI Here with several concerns. First he notes that he just finished a 30 day course of Cipro per Dr. Harlow Asa for an epididymitis. His testicular swelling and pain have resolved. He has had a dull pain in the left middle back for several weeks and Dr. Harlow Asa did not think it was from his kidney. His recent urinalyses have all been clear. He has been playing a lot of golf this summer. Finally he is worried about his BP. It was often in the 825O systolic at Dr. Puschinsky's office, and Mason [name] has been readings as high as 180s at home. No chest pain or SOB.    Review of Systems  Constitutional: Negative.   Respiratory: Negative.   Cardiovascular: Negative.   Genitourinary: Negative.   Neurological: Negative.        Objective:   Physical Exam  Constitutional: He appears well-developed and well-nourished.  Cardiovascular: Normal rate, regular rhythm, normal heart sounds and intact distal pulses.   Pulmonary/Chest: Effort normal and breath sounds normal.  Abdominal: Soft. Bowel sounds are normal. He exhibits no distension and no mass. There is no rebound and no guarding.  Mildly tender around the left side          Assessment & Plan:  For the HTN we will add HCTZ 25 mg to his Losartan. Recheck next month at his cpx. The side pain is muscular and probably came from his playing so much golf. He will observe, rest, use heat and Advil prn.

## 2014-02-11 ENCOUNTER — Encounter: Payer: Self-pay | Admitting: Family Medicine

## 2014-02-11 NOTE — Telephone Encounter (Signed)
No problem.

## 2014-02-16 DIAGNOSIS — R972 Elevated prostate specific antigen [PSA]: Secondary | ICD-10-CM | POA: Diagnosis not present

## 2014-02-17 ENCOUNTER — Ambulatory Visit (INDEPENDENT_AMBULATORY_CARE_PROVIDER_SITE_OTHER): Payer: Medicare Other | Admitting: Family Medicine

## 2014-02-17 ENCOUNTER — Encounter: Payer: Self-pay | Admitting: Family Medicine

## 2014-02-17 VITALS — BP 150/81 | HR 61 | Temp 97.9°F | Ht 64.0 in | Wt 154.0 lb

## 2014-02-17 DIAGNOSIS — M1990 Unspecified osteoarthritis, unspecified site: Secondary | ICD-10-CM | POA: Diagnosis not present

## 2014-02-17 DIAGNOSIS — E785 Hyperlipidemia, unspecified: Secondary | ICD-10-CM

## 2014-02-17 DIAGNOSIS — R972 Elevated prostate specific antigen [PSA]: Secondary | ICD-10-CM

## 2014-02-17 DIAGNOSIS — I10 Essential (primary) hypertension: Secondary | ICD-10-CM | POA: Diagnosis not present

## 2014-02-17 DIAGNOSIS — K219 Gastro-esophageal reflux disease without esophagitis: Secondary | ICD-10-CM

## 2014-02-17 DIAGNOSIS — E039 Hypothyroidism, unspecified: Secondary | ICD-10-CM | POA: Diagnosis not present

## 2014-02-17 DIAGNOSIS — Z23 Encounter for immunization: Secondary | ICD-10-CM | POA: Diagnosis not present

## 2014-02-17 LAB — POCT URINALYSIS DIPSTICK
Bilirubin, UA: NEGATIVE
Glucose, UA: NEGATIVE
Ketones, UA: NEGATIVE
Leukocytes, UA: NEGATIVE
Nitrite, UA: NEGATIVE
PH UA: 7
PROTEIN UA: NEGATIVE
RBC UA: NEGATIVE
SPEC GRAV UA: 1.01
UROBILINOGEN UA: 0.2

## 2014-02-17 LAB — CBC WITH DIFFERENTIAL/PLATELET
BASOS PCT: 0.7 % (ref 0.0–3.0)
Basophils Absolute: 0 10*3/uL (ref 0.0–0.1)
EOS PCT: 1.5 % (ref 0.0–5.0)
Eosinophils Absolute: 0.1 10*3/uL (ref 0.0–0.7)
HEMATOCRIT: 43.4 % (ref 39.0–52.0)
HEMOGLOBIN: 14.7 g/dL (ref 13.0–17.0)
LYMPHS ABS: 1.8 10*3/uL (ref 0.7–4.0)
Lymphocytes Relative: 29.2 % (ref 12.0–46.0)
MCHC: 33.9 g/dL (ref 30.0–36.0)
MCV: 92 fl (ref 78.0–100.0)
MONO ABS: 0.5 10*3/uL (ref 0.1–1.0)
Monocytes Relative: 7.7 % (ref 3.0–12.0)
NEUTROS ABS: 3.7 10*3/uL (ref 1.4–7.7)
Neutrophils Relative %: 60.9 % (ref 43.0–77.0)
Platelets: 204 10*3/uL (ref 150.0–400.0)
RBC: 4.72 Mil/uL (ref 4.22–5.81)
RDW: 13.2 % (ref 11.5–15.5)
WBC: 6 10*3/uL (ref 4.0–10.5)

## 2014-02-17 LAB — LIPID PANEL
Cholesterol: 157 mg/dL (ref 0–200)
HDL: 59.3 mg/dL (ref >39.00)
LDL Cholesterol: 83 mg/dL (ref 0–99)
NONHDL: 97.7
Total CHOL/HDL Ratio: 3
Triglycerides: 74 mg/dL (ref 0.0–149.0)
VLDL: 14.8 mg/dL (ref 0.0–40.0)

## 2014-02-17 LAB — BASIC METABOLIC PANEL
BUN: 22 mg/dL (ref 6–23)
CHLORIDE: 102 meq/L (ref 96–112)
CO2: 28 meq/L (ref 19–32)
CREATININE: 1.2 mg/dL (ref 0.4–1.5)
Calcium: 9.7 mg/dL (ref 8.4–10.5)
GFR: 65.33 mL/min (ref >60.00)
Glucose, Bld: 89 mg/dL (ref 70–99)
Potassium: 3.7 mEq/L (ref 3.5–5.1)
SODIUM: 138 meq/L (ref 135–145)

## 2014-02-17 LAB — HEPATIC FUNCTION PANEL
ALK PHOS: 47 U/L (ref 39–117)
ALT: 19 U/L (ref 0–53)
AST: 21 U/L (ref 0–37)
Albumin: 4.4 g/dL (ref 3.5–5.2)
BILIRUBIN DIRECT: 0 mg/dL (ref 0.0–0.3)
TOTAL PROTEIN: 7.3 g/dL (ref 6.0–8.3)
Total Bilirubin: 0.9 mg/dL (ref 0.2–1.2)

## 2014-02-17 LAB — TSH: TSH: 1.28 u[IU]/mL (ref 0.35–4.50)

## 2014-02-17 MED ORDER — ROSUVASTATIN CALCIUM 10 MG PO TABS: 10.0000 mg | ORAL_TABLET | Freq: Every day | ORAL | Status: DC

## 2014-02-17 MED ORDER — LEVOTHYROXINE SODIUM 75 MCG PO TABS: 75.0000 ug | ORAL_TABLET | Freq: Every day | ORAL | Status: DC

## 2014-02-17 MED ORDER — LOSARTAN POTASSIUM-HCTZ 100-25 MG PO TABS: 1.0000 | ORAL_TABLET | Freq: Every day | ORAL | Status: DC

## 2014-02-17 NOTE — Progress Notes (Signed)
Pre visit review using our clinic review tool, if applicable. No additional management support is needed unless otherwise documented below in the visit note. 

## 2014-02-17 NOTE — Progress Notes (Signed)
   Subjective:    Patient ID: Mason [name], male    DOB: 30-Nov-1940, 73 y.o.   MRN: [id number]  HPI 73 yr old male for a cpx. He has a few things to discuss. First he has been seeing Dr. Burnell Blanks for years for an elevated PSA, which has usually been in the range of 8 to 11. However his last one 2 months ago had shot up to 101. Raydan had been treated with 3 courses of antibiotics for epididymitis, and Dr. Burnell Blanks felt the elevated PSA was a result of the infection. He had another PSA drawn yesterday and he is waiting on this result. Also he has had some pain in the right hip area for several weeks. He remains active and plays golf 3 days a week.    Review of Systems  Constitutional: Negative.   HENT: Negative.   Eyes: Negative.   Respiratory: Negative.   Cardiovascular: Negative.   Gastrointestinal: Negative.   Genitourinary: Negative.   Musculoskeletal: Negative.   Skin: Negative.   Neurological: Negative.   Psychiatric/Behavioral: Negative.        Objective:   Physical Exam  Constitutional: He is oriented to person, place, and time. He appears well-developed and well-nourished. No distress.  HENT:  Head: Normocephalic and atraumatic.  Right Ear: External ear normal.  Left Ear: External ear normal.  Nose: Nose normal.  Mouth/Throat: Oropharynx is clear and moist. No oropharyngeal exudate.  Eyes: Conjunctivae and EOM are normal. Pupils are equal, round, and reactive to light. Right eye exhibits no discharge. Left eye exhibits no discharge. No scleral icterus.  Neck: Neck supple. No JVD present. No tracheal deviation present. No thyromegaly present.  Cardiovascular: Normal rate, regular rhythm, normal heart sounds and intact distal pulses.  Exam reveals no gallop and no friction rub.   No murmur heard. EKG normal  Pulmonary/Chest: Effort normal and breath sounds normal. No respiratory distress. He has no wheezes. He has no rales. He exhibits no tenderness.  Abdominal:  Soft. Bowel sounds are normal. He exhibits no distension and no mass. There is no tenderness. There is no rebound and no guarding.  Genitourinary: Rectum normal, prostate normal and penis normal. Guaiac negative stool. No penile tenderness.  Musculoskeletal: Normal range of motion. He exhibits no edema and no tenderness.  Lymphadenopathy:    He has no cervical adenopathy.  Neurological: He is alert and oriented to person, place, and time. He has normal reflexes. No cranial nerve deficit. He exhibits normal muscle tone. Coordination normal.  Skin: Skin is warm and dry. No rash noted. He is not diaphoretic. No erythema. No pallor.  Psychiatric: He has a normal mood and affect. His behavior is normal. Judgment and thought content normal.          Assessment & Plan:  Well exam. Get fasting labs.

## 2014-02-17 NOTE — Addendum Note (Signed)
Addended by: Aggie Hacker A on: 02/17/2014 12:10 PM   Modules accepted: Orders

## 2014-05-03 ENCOUNTER — Encounter: Payer: Self-pay | Admitting: Family Medicine

## 2014-05-03 ENCOUNTER — Ambulatory Visit (INDEPENDENT_AMBULATORY_CARE_PROVIDER_SITE_OTHER): Payer: Medicare Other | Admitting: Family Medicine

## 2014-05-03 VITALS — BP 135/77 | HR 66 | Temp 97.8°F | Ht 64.0 in | Wt 157.0 lb

## 2014-05-03 DIAGNOSIS — K219 Gastro-esophageal reflux disease without esophagitis: Secondary | ICD-10-CM

## 2014-05-03 DIAGNOSIS — M25551 Pain in right hip: Secondary | ICD-10-CM | POA: Diagnosis not present

## 2014-05-03 NOTE — Progress Notes (Signed)
   Subjective:    Patient ID: Mason [name], male    DOB: July 22, 1940, 73 y.o.   MRN: [id number]  HPI Here for several things. First he has had intermittent pain in the right hip for the past year. He has tried NSAIDs with mixed results. He would like to see an orthopedist about this. Also he has had a month of intermittent mild chest tightness which sometimes starts after a meal. No pain or SOB. This does not inhibit his activities and still walks daily and plays golf.    Review of Systems  Constitutional: Negative.   Respiratory: Positive for chest tightness. Negative for cough, shortness of breath and wheezing.   Cardiovascular: Negative.        Objective:   Physical Exam  Constitutional: He appears well-developed and well-nourished.  Cardiovascular: Normal rate, regular rhythm, normal heart sounds and intact distal pulses.   Pulmonary/Chest: Effort normal and breath sounds normal. No respiratory distress. He has no wheezes. He has no rales. He exhibits no tenderness.          Assessment & Plan:  Refer to Orthopedics for the hip pain. His chest sx sound like GERD, so he will take a Prilosec every morning for the next month and let me know how he feels after that

## 2014-05-03 NOTE — Progress Notes (Signed)
Pre visit review using our clinic review tool, if applicable. No additional management support is needed unless otherwise documented below in the visit note. 

## 2014-05-12 DIAGNOSIS — M7061 Trochanteric bursitis, right hip: Secondary | ICD-10-CM | POA: Diagnosis not present

## 2014-05-18 DIAGNOSIS — R972 Elevated prostate specific antigen [PSA]: Secondary | ICD-10-CM | POA: Diagnosis not present

## 2014-05-18 DIAGNOSIS — R358 Other polyuria: Secondary | ICD-10-CM | POA: Diagnosis not present

## 2014-12-07 DIAGNOSIS — R972 Elevated prostate specific antigen [PSA]: Secondary | ICD-10-CM | POA: Diagnosis not present

## 2014-12-07 DIAGNOSIS — R358 Other polyuria: Secondary | ICD-10-CM | POA: Diagnosis not present

## 2014-12-16 DIAGNOSIS — N289 Disorder of kidney and ureter, unspecified: Secondary | ICD-10-CM | POA: Diagnosis not present

## 2014-12-28 DIAGNOSIS — N401 Enlarged prostate with lower urinary tract symptoms: Secondary | ICD-10-CM | POA: Diagnosis not present

## 2014-12-28 DIAGNOSIS — N138 Other obstructive and reflux uropathy: Secondary | ICD-10-CM | POA: Diagnosis not present

## 2014-12-28 DIAGNOSIS — R312 Other microscopic hematuria: Secondary | ICD-10-CM | POA: Diagnosis not present

## 2014-12-28 DIAGNOSIS — N289 Disorder of kidney and ureter, unspecified: Secondary | ICD-10-CM | POA: Diagnosis not present

## 2014-12-28 DIAGNOSIS — N3289 Other specified disorders of bladder: Secondary | ICD-10-CM | POA: Diagnosis not present

## 2015-02-14 ENCOUNTER — Other Ambulatory Visit (INDEPENDENT_AMBULATORY_CARE_PROVIDER_SITE_OTHER): Payer: Medicare Other

## 2015-02-14 DIAGNOSIS — I10 Essential (primary) hypertension: Secondary | ICD-10-CM

## 2015-02-14 DIAGNOSIS — Z0000 Encounter for general adult medical examination without abnormal findings: Secondary | ICD-10-CM

## 2015-02-14 DIAGNOSIS — D3131 Benign neoplasm of right choroid: Secondary | ICD-10-CM | POA: Diagnosis not present

## 2015-02-14 DIAGNOSIS — Z125 Encounter for screening for malignant neoplasm of prostate: Secondary | ICD-10-CM

## 2015-02-14 LAB — BASIC METABOLIC PANEL
BUN: 19 mg/dL (ref 6–23)
CALCIUM: 9.4 mg/dL (ref 8.4–10.5)
CO2: 28 meq/L (ref 19–32)
CREATININE: 1.17 mg/dL (ref 0.40–1.50)
Chloride: 105 mEq/L (ref 96–112)
GFR: 65.15 mL/min (ref >60.00)
Glucose, Bld: 90 mg/dL (ref 70–99)
Potassium: 3.5 mEq/L (ref 3.5–5.1)
Sodium: 142 mEq/L (ref 135–145)

## 2015-02-14 LAB — LIPID PANEL
CHOLESTEROL: 138 mg/dL (ref 0–200)
HDL: 53.6 mg/dL (ref >39.00)
LDL Cholesterol: 75 mg/dL (ref 0–99)
NonHDL: 84.53
TRIGLYCERIDES: 49 mg/dL (ref 0.0–149.0)
Total CHOL/HDL Ratio: 3
VLDL: 9.8 mg/dL (ref 0.0–40.0)

## 2015-02-14 LAB — CBC WITH DIFFERENTIAL/PLATELET
BASOS ABS: 0 10*3/uL (ref 0.0–0.1)
Basophils Relative: 0.5 % (ref 0.0–3.0)
EOS ABS: 0.2 10*3/uL (ref 0.0–0.7)
Eosinophils Relative: 2.8 % (ref 0.0–5.0)
HEMATOCRIT: 43.4 % (ref 39.0–52.0)
Hemoglobin: 14.8 g/dL (ref 13.0–17.0)
LYMPHS PCT: 28.6 % (ref 12.0–46.0)
Lymphs Abs: 1.9 10*3/uL (ref 0.7–4.0)
MCHC: 34 g/dL (ref 30.0–36.0)
MCV: 93.7 fl (ref 78.0–100.0)
Monocytes Absolute: 0.4 10*3/uL (ref 0.1–1.0)
Monocytes Relative: 6.8 % (ref 3.0–12.0)
NEUTROS ABS: 4 10*3/uL (ref 1.4–7.7)
NEUTROS PCT: 61.3 % (ref 43.0–77.0)
PLATELETS: 199 10*3/uL (ref 150.0–400.0)
RBC: 4.64 Mil/uL (ref 4.22–5.81)
RDW: 13.2 % (ref 11.5–15.5)
WBC: 6.5 10*3/uL (ref 4.0–10.5)

## 2015-02-14 LAB — HEPATIC FUNCTION PANEL
ALK PHOS: 48 U/L (ref 39–117)
ALT: 14 U/L (ref 0–53)
AST: 17 U/L (ref 0–37)
Albumin: 4.1 g/dL (ref 3.5–5.2)
BILIRUBIN DIRECT: 0.2 mg/dL (ref 0.0–0.3)
TOTAL PROTEIN: 6.4 g/dL (ref 6.0–8.3)
Total Bilirubin: 0.7 mg/dL (ref 0.2–1.2)

## 2015-02-14 LAB — POCT URINALYSIS DIPSTICK
Bilirubin, UA: NEGATIVE
Blood, UA: NEGATIVE
Glucose, UA: NEGATIVE
Ketones, UA: NEGATIVE
LEUKOCYTES UA: NEGATIVE
NITRITE UA: NEGATIVE
PROTEIN UA: NEGATIVE
Spec Grav, UA: 1.02
UROBILINOGEN UA: 0.2
pH, UA: 7

## 2015-02-14 LAB — TSH: TSH: 1.92 u[IU]/mL (ref 0.35–4.50)

## 2015-02-14 LAB — PSA: PSA: 13.07 ng/mL — ABNORMAL HIGH (ref 0.10–4.00)

## 2015-02-21 ENCOUNTER — Ambulatory Visit (INDEPENDENT_AMBULATORY_CARE_PROVIDER_SITE_OTHER): Payer: Medicare Other | Admitting: Family Medicine

## 2015-02-21 ENCOUNTER — Encounter: Payer: Self-pay | Admitting: Family Medicine

## 2015-02-21 VITALS — BP 133/75 | HR 59 | Temp 98.0°F | Ht 64.0 in | Wt 157.0 lb

## 2015-02-21 DIAGNOSIS — R972 Elevated prostate specific antigen [PSA]: Secondary | ICD-10-CM

## 2015-02-21 DIAGNOSIS — N401 Enlarged prostate with lower urinary tract symptoms: Secondary | ICD-10-CM

## 2015-02-21 DIAGNOSIS — M150 Primary generalized (osteo)arthritis: Secondary | ICD-10-CM

## 2015-02-21 DIAGNOSIS — M159 Polyosteoarthritis, unspecified: Secondary | ICD-10-CM

## 2015-02-21 DIAGNOSIS — N138 Other obstructive and reflux uropathy: Secondary | ICD-10-CM

## 2015-02-21 DIAGNOSIS — Z23 Encounter for immunization: Secondary | ICD-10-CM | POA: Diagnosis not present

## 2015-02-21 DIAGNOSIS — E785 Hyperlipidemia, unspecified: Secondary | ICD-10-CM

## 2015-02-21 DIAGNOSIS — I10 Essential (primary) hypertension: Secondary | ICD-10-CM | POA: Diagnosis not present

## 2015-02-21 DIAGNOSIS — E039 Hypothyroidism, unspecified: Secondary | ICD-10-CM | POA: Diagnosis not present

## 2015-02-21 MED ORDER — LOSARTAN POTASSIUM-HCTZ 100-25 MG PO TABS: 1.0000 | ORAL_TABLET | Freq: Every day | ORAL | Status: DC

## 2015-02-21 MED ORDER — ROSUVASTATIN CALCIUM 10 MG PO TABS: 10.0000 mg | ORAL_TABLET | Freq: Every day | ORAL | Status: DC

## 2015-02-21 MED ORDER — LEVOTHYROXINE SODIUM 75 MCG PO TABS: 75.0000 ug | ORAL_TABLET | Freq: Every day | ORAL | Status: DC

## 2015-02-21 NOTE — Progress Notes (Signed)
Pre visit review using our clinic review tool, if applicable. No additional management support is needed unless otherwise documented below in the visit note. 

## 2015-02-21 NOTE — Progress Notes (Signed)
   Subjective:    Patient ID: Mason [name], male    DOB: [birth year], 74 y.o.   MRN: [id number]  HPI 74 yr old male to follow up several issues. He feels well, remains active, and he plays golf several days a week. His BP is stable. He recently saw Dr. Harlow Asa about his BPH and although his PSA keeps going up, this seems top be a benign process. He had a CT scan showing his prostate to be quite large. He had some blood in the urine at that time, so a cystoscopy was performed. This came out clear.   Review of Systems  Constitutional: Negative.   HENT: Negative.   Eyes: Negative.   Respiratory: Negative.   Cardiovascular: Negative.   Gastrointestinal: Negative.   Genitourinary: Negative.   Musculoskeletal: Negative.   Skin: Negative.   Neurological: Negative.   Psychiatric/Behavioral: Negative.        Objective:   Physical Exam  Constitutional: He is oriented to person, place, and time. He appears well-developed and well-nourished. No distress.  HENT:  Head: Normocephalic and atraumatic.  Right Ear: External ear normal.  Left Ear: External ear normal.  Nose: Nose normal.  Mouth/Throat: Oropharynx is clear and moist. No oropharyngeal exudate.  Eyes: Conjunctivae and EOM are normal. Pupils are equal, round, and reactive to light. Right eye exhibits no discharge. Left eye exhibits no discharge. No scleral icterus.  Neck: Neck supple. No JVD present. No tracheal deviation present. No thyromegaly present.  Cardiovascular: Normal rate, regular rhythm, normal heart sounds and intact distal pulses.  Exam reveals no gallop and no friction rub.   No murmur heard. Pulmonary/Chest: Effort normal and breath sounds normal. No respiratory distress. He has no wheezes. He has no rales. He exhibits no tenderness.  Abdominal: Soft. Bowel sounds are normal. He exhibits no distension and no mass. There is no tenderness. There is no rebound and no guarding.  Genitourinary: Rectum normal.    Musculoskeletal: Normal range of motion. He exhibits no edema or tenderness.  Lymphadenopathy:    He has no cervical adenopathy.  Neurological: He is alert and oriented to person, place, and time. He has normal reflexes. No cranial nerve deficit. He exhibits normal muscle tone. Coordination normal.  Skin: Skin is warm and dry. No rash noted. He is not diaphoretic. No erythema. No pallor.  Psychiatric: He has a normal mood and affect. His behavior is normal. Judgment and thought content normal.          Assessment & Plan:  His BP is stable. His BPH is stable and he will see Dr. Harlow Asa every 6 months. His thyroid level is stable. His lipids are stable. We discussed diet and exercise advice.

## 2015-06-06 DIAGNOSIS — B354 Tinea corporis: Secondary | ICD-10-CM | POA: Diagnosis not present

## 2015-06-26 HISTORY — PX: BASAL CELL CARCINOMA EXCISION: SHX1214

## 2015-07-05 ENCOUNTER — Encounter: Payer: Self-pay | Admitting: Internal Medicine

## 2015-07-11 DIAGNOSIS — R972 Elevated prostate specific antigen [PSA]: Secondary | ICD-10-CM | POA: Diagnosis not present

## 2015-07-11 DIAGNOSIS — R358 Other polyuria: Secondary | ICD-10-CM | POA: Diagnosis not present

## 2015-08-25 DIAGNOSIS — L570 Actinic keratosis: Secondary | ICD-10-CM | POA: Diagnosis not present

## 2015-08-25 DIAGNOSIS — L821 Other seborrheic keratosis: Secondary | ICD-10-CM | POA: Diagnosis not present

## 2015-08-25 DIAGNOSIS — D485 Neoplasm of uncertain behavior of skin: Secondary | ICD-10-CM | POA: Diagnosis not present

## 2015-09-03 ENCOUNTER — Other Ambulatory Visit: Payer: Self-pay | Admitting: Family Medicine

## 2015-09-08 DIAGNOSIS — D0439 Carcinoma in situ of skin of other parts of face: Secondary | ICD-10-CM | POA: Diagnosis not present

## 2015-09-08 DIAGNOSIS — L821 Other seborrheic keratosis: Secondary | ICD-10-CM | POA: Diagnosis not present

## 2015-09-08 DIAGNOSIS — D485 Neoplasm of uncertain behavior of skin: Secondary | ICD-10-CM | POA: Diagnosis not present

## 2015-11-24 DIAGNOSIS — D485 Neoplasm of uncertain behavior of skin: Secondary | ICD-10-CM | POA: Diagnosis not present

## 2015-11-24 DIAGNOSIS — L570 Actinic keratosis: Secondary | ICD-10-CM | POA: Diagnosis not present

## 2016-02-15 ENCOUNTER — Other Ambulatory Visit: Payer: Self-pay | Admitting: Family Medicine

## 2016-02-16 NOTE — Telephone Encounter (Signed)
Rx refill sent to pharmacy. 

## 2016-02-29 ENCOUNTER — Other Ambulatory Visit (INDEPENDENT_AMBULATORY_CARE_PROVIDER_SITE_OTHER): Payer: Medicare Other

## 2016-02-29 DIAGNOSIS — Z0000 Encounter for general adult medical examination without abnormal findings: Secondary | ICD-10-CM

## 2016-02-29 LAB — POC URINALSYSI DIPSTICK (AUTOMATED)
Bilirubin, UA: NEGATIVE
Blood, UA: NEGATIVE
Glucose, UA: NEGATIVE
Ketones, UA: NEGATIVE
Leukocytes, UA: NEGATIVE
Nitrite, UA: NEGATIVE
Protein, UA: NEGATIVE
Spec Grav, UA: 1.02
Urobilinogen, UA: 0.2
pH, UA: 6.5

## 2016-02-29 LAB — HEPATIC FUNCTION PANEL
ALK PHOS: 50 U/L (ref 39–117)
ALT: 17 U/L (ref 0–53)
AST: 17 U/L (ref 0–37)
Albumin: 4.3 g/dL (ref 3.5–5.2)
BILIRUBIN DIRECT: 0.2 mg/dL (ref 0.0–0.3)
BILIRUBIN TOTAL: 0.8 mg/dL (ref 0.2–1.2)
TOTAL PROTEIN: 6.5 g/dL (ref 6.0–8.3)

## 2016-02-29 LAB — BASIC METABOLIC PANEL
BUN: 33 mg/dL — AB (ref 6–23)
CALCIUM: 9.3 mg/dL (ref 8.4–10.5)
CO2: 29 mEq/L (ref 19–32)
CREATININE: 1.19 mg/dL (ref 0.40–1.50)
Chloride: 103 mEq/L (ref 96–112)
GFR: 63.7 mL/min (ref >60.00)
Glucose, Bld: 89 mg/dL (ref 70–99)
Potassium: 3.3 mEq/L — ABNORMAL LOW (ref 3.5–5.1)
Sodium: 140 mEq/L (ref 135–145)

## 2016-02-29 LAB — CBC WITH DIFFERENTIAL/PLATELET
BASOS ABS: 0 10*3/uL (ref 0.0–0.1)
Basophils Relative: 0.6 % (ref 0.0–3.0)
EOS ABS: 0.2 10*3/uL (ref 0.0–0.7)
Eosinophils Relative: 2.9 % (ref 0.0–5.0)
HEMATOCRIT: 42.8 % (ref 39.0–52.0)
HEMOGLOBIN: 15 g/dL (ref 13.0–17.0)
LYMPHS PCT: 33.5 % (ref 12.0–46.0)
Lymphs Abs: 2 10*3/uL (ref 0.7–4.0)
MCHC: 35.2 g/dL (ref 30.0–36.0)
MCV: 91.1 fl (ref 78.0–100.0)
MONO ABS: 0.5 10*3/uL (ref 0.1–1.0)
Monocytes Relative: 8 % (ref 3.0–12.0)
Neutro Abs: 3.4 10*3/uL (ref 1.4–7.7)
Neutrophils Relative %: 55 % (ref 43.0–77.0)
PLATELETS: 209 10*3/uL (ref 150.0–400.0)
RBC: 4.69 Mil/uL (ref 4.22–5.81)
RDW: 13 % (ref 11.5–15.5)
WBC: 6.1 10*3/uL (ref 4.0–10.5)

## 2016-02-29 LAB — LIPID PANEL
CHOL/HDL RATIO: 3
Cholesterol: 147 mg/dL (ref 0–200)
HDL: 50.4 mg/dL (ref >39.00)
LDL CALC: 84 mg/dL (ref 0–99)
NONHDL: 96.99
TRIGLYCERIDES: 66 mg/dL (ref 0.0–149.0)
VLDL: 13.2 mg/dL (ref 0.0–40.0)

## 2016-02-29 LAB — TSH: TSH: 2.76 u[IU]/mL (ref 0.35–4.50)

## 2016-02-29 LAB — PSA: PSA: 14.52 ng/mL — ABNORMAL HIGH (ref 0.10–4.00)

## 2016-03-01 MED ORDER — POTASSIUM CHLORIDE ER 10 MEQ PO TBCR: 10.0000 meq | EXTENDED_RELEASE_TABLET | Freq: Every day | ORAL | 11 refills | Status: DC

## 2016-03-02 ENCOUNTER — Telehealth: Payer: Self-pay | Admitting: Family Medicine

## 2016-03-02 NOTE — Telephone Encounter (Signed)
I spoke with pt and went over recent lab results.  

## 2016-03-02 NOTE — Telephone Encounter (Signed)
Pt is returning sylvia call °

## 2016-03-05 ENCOUNTER — Ambulatory Visit (INDEPENDENT_AMBULATORY_CARE_PROVIDER_SITE_OTHER): Payer: Medicare Other | Admitting: Family Medicine

## 2016-03-05 ENCOUNTER — Encounter: Payer: Self-pay | Admitting: Family Medicine

## 2016-03-05 VITALS — BP 124/70 | HR 59 | Temp 97.9°F | Ht 63.4 in | Wt 157.4 lb

## 2016-03-05 DIAGNOSIS — I10 Essential (primary) hypertension: Secondary | ICD-10-CM

## 2016-03-05 DIAGNOSIS — M159 Polyosteoarthritis, unspecified: Secondary | ICD-10-CM

## 2016-03-05 DIAGNOSIS — K219 Gastro-esophageal reflux disease without esophagitis: Secondary | ICD-10-CM | POA: Diagnosis not present

## 2016-03-05 DIAGNOSIS — E039 Hypothyroidism, unspecified: Secondary | ICD-10-CM

## 2016-03-05 DIAGNOSIS — M150 Primary generalized (osteo)arthritis: Secondary | ICD-10-CM

## 2016-03-05 DIAGNOSIS — N401 Enlarged prostate with lower urinary tract symptoms: Secondary | ICD-10-CM

## 2016-03-05 DIAGNOSIS — R972 Elevated prostate specific antigen [PSA]: Secondary | ICD-10-CM

## 2016-03-05 DIAGNOSIS — E785 Hyperlipidemia, unspecified: Secondary | ICD-10-CM | POA: Diagnosis not present

## 2016-03-05 DIAGNOSIS — Z23 Encounter for immunization: Secondary | ICD-10-CM | POA: Diagnosis not present

## 2016-03-05 DIAGNOSIS — N138 Other obstructive and reflux uropathy: Secondary | ICD-10-CM

## 2016-03-05 MED ORDER — LOSARTAN POTASSIUM-HCTZ 100-25 MG PO TABS: 1.0000 | ORAL_TABLET | Freq: Every day | ORAL | 3 refills | Status: DC

## 2016-03-05 MED ORDER — LEVOTHYROXINE SODIUM 75 MCG PO TABS: 75.0000 ug | ORAL_TABLET | Freq: Every day | ORAL | 3 refills | Status: DC

## 2016-03-05 MED ORDER — ROSUVASTATIN CALCIUM 10 MG PO TABS: 10.0000 mg | ORAL_TABLET | Freq: Every day | ORAL | 1 refills | Status: DC

## 2016-03-05 NOTE — Progress Notes (Signed)
   Subjective:    Patient ID: Mason [name], male    DOB: 1941/01/28, 75 y.o.   MRN: [id number]  HPI 75 year old male to follow up on several issues. He has been feeling well and and he stays active. He and his wife walk 45-60 minutes every day. He took a cruise to [place] with his grandchildren this past summer. His BP is stable. He is due to see Dr. Burnell Blanks soon to follow up BPH and an elevated PSA. This has risen to 20, which is the highest it has ever been. He has had 6 biopsies over the years, all negative.    Review of Systems  Constitutional: Negative.   HENT: Negative.   Eyes: Negative.   Respiratory: Negative.   Cardiovascular: Negative.   Gastrointestinal: Negative.   Genitourinary: Negative.   Musculoskeletal: Negative.   Skin: Negative.   Neurological: Negative.   Psychiatric/Behavioral: Negative.        Objective:   Physical Exam  Constitutional: He is oriented to person, place, and time. He appears well-developed and well-nourished. No distress.  HENT:  Head: Normocephalic and atraumatic.  Right Ear: External ear normal.  Left Ear: External ear normal.  Nose: Nose normal.  Mouth/Throat: Oropharynx is clear and moist. No oropharyngeal exudate.  Eyes: Conjunctivae and EOM are normal. Pupils are equal, round, and reactive to light. Right eye exhibits no discharge. Left eye exhibits no discharge. No scleral icterus.  Neck: Neck supple. No JVD present. No tracheal deviation present. No thyromegaly present.  Cardiovascular: Normal rate, regular rhythm, normal heart sounds and intact distal pulses.  Exam reveals no gallop and no friction rub.   No murmur heard. EKG shows bradycardia only   Pulmonary/Chest: Effort normal and breath sounds normal. No respiratory distress. He has no wheezes. He has no rales. He exhibits no tenderness.  Abdominal: Soft. Bowel sounds are normal. He exhibits no distension and no mass. There is no tenderness. There is no rebound and no  guarding.  Musculoskeletal: Normal range of motion. He exhibits no edema or tenderness.  Lymphadenopathy:    He has no cervical adenopathy.  Neurological: He is alert and oriented to person, place, and time. He has normal reflexes. No cranial nerve deficit. He exhibits normal muscle tone. Coordination normal.  Skin: Skin is warm and dry. No rash noted. He is not diaphoretic. No erythema. No pallor.  Psychiatric: He has a normal mood and affect. His behavior is normal. Judgment and thought content normal.          Assessment & Plan:  His HTN is stable. His hypothyroidism is stable. His lipids are well controlled. He will see Dr. Burnell Blanks as noted. I suggested he ask Dr. Burnell Blanks whether a prostate MRI scan would be helpful since he does not want to have nay further biopsies done.  Laurey Morale, MD

## 2016-03-12 DIAGNOSIS — R972 Elevated prostate specific antigen [PSA]: Secondary | ICD-10-CM | POA: Diagnosis not present

## 2016-03-12 DIAGNOSIS — N289 Disorder of kidney and ureter, unspecified: Secondary | ICD-10-CM | POA: Diagnosis not present

## 2016-04-02 DIAGNOSIS — Z823 Family history of stroke: Secondary | ICD-10-CM | POA: Diagnosis not present

## 2016-04-02 DIAGNOSIS — R42 Dizziness and giddiness: Secondary | ICD-10-CM | POA: Diagnosis not present

## 2016-04-02 DIAGNOSIS — E785 Hyperlipidemia, unspecified: Secondary | ICD-10-CM | POA: Diagnosis not present

## 2016-04-02 DIAGNOSIS — R531 Weakness: Secondary | ICD-10-CM | POA: Diagnosis not present

## 2016-04-02 DIAGNOSIS — E039 Hypothyroidism, unspecified: Secondary | ICD-10-CM | POA: Diagnosis not present

## 2016-04-02 DIAGNOSIS — Z7982 Long term (current) use of aspirin: Secondary | ICD-10-CM | POA: Diagnosis not present

## 2016-04-02 DIAGNOSIS — R51 Headache: Secondary | ICD-10-CM | POA: Diagnosis not present

## 2016-04-02 DIAGNOSIS — Z79899 Other long term (current) drug therapy: Secondary | ICD-10-CM | POA: Diagnosis not present

## 2016-04-02 DIAGNOSIS — Z8249 Family history of ischemic heart disease and other diseases of the circulatory system: Secondary | ICD-10-CM | POA: Diagnosis not present

## 2016-04-02 DIAGNOSIS — R0989 Other specified symptoms and signs involving the circulatory and respiratory systems: Secondary | ICD-10-CM | POA: Diagnosis not present

## 2016-04-02 DIAGNOSIS — R001 Bradycardia, unspecified: Secondary | ICD-10-CM | POA: Diagnosis not present

## 2016-04-02 DIAGNOSIS — R251 Tremor, unspecified: Secondary | ICD-10-CM | POA: Diagnosis not present

## 2016-04-02 DIAGNOSIS — R2681 Unsteadiness on feet: Secondary | ICD-10-CM | POA: Diagnosis not present

## 2016-04-02 DIAGNOSIS — R55 Syncope and collapse: Secondary | ICD-10-CM | POA: Diagnosis not present

## 2016-04-02 DIAGNOSIS — I10 Essential (primary) hypertension: Secondary | ICD-10-CM | POA: Diagnosis not present

## 2016-04-02 DIAGNOSIS — R0602 Shortness of breath: Secondary | ICD-10-CM | POA: Diagnosis not present

## 2016-04-03 DIAGNOSIS — R938 Abnormal findings on diagnostic imaging of other specified body structures: Secondary | ICD-10-CM | POA: Diagnosis not present

## 2016-04-03 DIAGNOSIS — N400 Enlarged prostate without lower urinary tract symptoms: Secondary | ICD-10-CM | POA: Diagnosis not present

## 2016-04-03 DIAGNOSIS — I10 Essential (primary) hypertension: Secondary | ICD-10-CM | POA: Diagnosis not present

## 2016-04-03 DIAGNOSIS — E039 Hypothyroidism, unspecified: Secondary | ICD-10-CM | POA: Diagnosis not present

## 2016-04-03 DIAGNOSIS — R0602 Shortness of breath: Secondary | ICD-10-CM | POA: Diagnosis not present

## 2016-04-03 DIAGNOSIS — R51 Headache: Secondary | ICD-10-CM | POA: Diagnosis not present

## 2016-04-03 DIAGNOSIS — R42 Dizziness and giddiness: Secondary | ICD-10-CM | POA: Diagnosis not present

## 2016-04-03 DIAGNOSIS — E785 Hyperlipidemia, unspecified: Secondary | ICD-10-CM | POA: Diagnosis not present

## 2016-04-03 DIAGNOSIS — I340 Nonrheumatic mitral (valve) insufficiency: Secondary | ICD-10-CM | POA: Diagnosis not present

## 2016-04-04 DIAGNOSIS — E039 Hypothyroidism, unspecified: Secondary | ICD-10-CM | POA: Diagnosis not present

## 2016-04-04 DIAGNOSIS — R0602 Shortness of breath: Secondary | ICD-10-CM | POA: Diagnosis not present

## 2016-04-04 DIAGNOSIS — I10 Essential (primary) hypertension: Secondary | ICD-10-CM | POA: Diagnosis not present

## 2016-04-04 DIAGNOSIS — R251 Tremor, unspecified: Secondary | ICD-10-CM | POA: Diagnosis not present

## 2016-04-04 DIAGNOSIS — R51 Headache: Secondary | ICD-10-CM | POA: Diagnosis not present

## 2016-04-04 DIAGNOSIS — E785 Hyperlipidemia, unspecified: Secondary | ICD-10-CM | POA: Diagnosis not present

## 2016-04-04 DIAGNOSIS — R918 Other nonspecific abnormal finding of lung field: Secondary | ICD-10-CM | POA: Diagnosis not present

## 2016-04-04 DIAGNOSIS — R001 Bradycardia, unspecified: Secondary | ICD-10-CM | POA: Diagnosis not present

## 2016-07-04 ENCOUNTER — Encounter: Payer: Self-pay | Admitting: Internal Medicine

## 2016-07-30 ENCOUNTER — Telehealth: Payer: Self-pay | Admitting: Family Medicine

## 2016-09-04 ENCOUNTER — Ambulatory Visit (AMBULATORY_SURGERY_CENTER): Payer: Self-pay

## 2016-09-04 VITALS — Ht 64.0 in | Wt 164.6 lb

## 2016-09-04 DIAGNOSIS — Z860100 Personal history of colon polyps, unspecified: Secondary | ICD-10-CM

## 2016-09-04 MED ORDER — SUPREP BOWEL PREP KIT 17.5-3.13-1.6 GM/177ML PO SOLN: 1.0000 | Freq: Once | ORAL | 0 refills | Status: AC

## 2016-09-04 NOTE — Progress Notes (Signed)
No allergies to eggs or soy No diet meds No home oxygen No past problems with anesthesia  Declined emmi 

## 2016-09-10 DIAGNOSIS — R339 Retention of urine, unspecified: Secondary | ICD-10-CM | POA: Diagnosis not present

## 2016-09-10 DIAGNOSIS — R358 Other polyuria: Secondary | ICD-10-CM | POA: Diagnosis not present

## 2016-09-10 DIAGNOSIS — R972 Elevated prostate specific antigen [PSA]: Secondary | ICD-10-CM | POA: Diagnosis not present

## 2016-09-12 DIAGNOSIS — D3131 Benign neoplasm of right choroid: Secondary | ICD-10-CM | POA: Diagnosis not present

## 2016-09-18 ENCOUNTER — Ambulatory Visit (AMBULATORY_SURGERY_CENTER): Payer: Medicare Other | Admitting: Internal Medicine

## 2016-09-18 ENCOUNTER — Encounter: Payer: Self-pay | Admitting: Internal Medicine

## 2016-09-18 VITALS — BP 100/55 | HR 47 | Temp 97.5°F | Resp 16 | Ht 64.0 in | Wt 164.0 lb

## 2016-09-18 DIAGNOSIS — Z86010 Personal history of colonic polyps: Secondary | ICD-10-CM

## 2016-09-18 DIAGNOSIS — D123 Benign neoplasm of transverse colon: Secondary | ICD-10-CM | POA: Diagnosis not present

## 2016-09-18 DIAGNOSIS — Z1211 Encounter for screening for malignant neoplasm of colon: Secondary | ICD-10-CM | POA: Diagnosis not present

## 2016-09-18 DIAGNOSIS — Z8371 Family history of colonic polyps: Secondary | ICD-10-CM

## 2016-09-18 HISTORY — PX: COLONOSCOPY: SHX174

## 2016-09-18 MED ORDER — SODIUM CHLORIDE 0.9 % IV SOLN: 500.0000 mL | INTRAVENOUS | Status: DC

## 2016-09-18 NOTE — Patient Instructions (Signed)
YOU HAD AN ENDOSCOPIC PROCEDURE TODAY AT [facility]:   Refer to the procedure report that was given to you for any specific questions about what was found during the examination.  If the procedure report does not answer your questions, please call your gastroenterologist to clarify.  If you requested that your care partner not be given the details of your procedure findings, then the procedure report has been included in a sealed envelope for you to review at your convenience later.  YOU SHOULD EXPECT: Some feelings of bloating in the abdomen. Passage of more gas than usual.  Walking can help get rid of the air that was put into your GI tract during the procedure and reduce the bloating. If you had a lower endoscopy (such as a colonoscopy or flexible sigmoidoscopy) you may notice spotting of blood in your stool or on the toilet paper. If you underwent a bowel prep for your procedure, you may not have a normal bowel movement for a few days.  Please Note:  You might notice some irritation and congestion in your nose or some drainage.  This is from the oxygen used during your procedure.  There is no need for concern and it should clear up in a day or so.  SYMPTOMS TO REPORT IMMEDIATELY:   Following lower endoscopy (colonoscopy or flexible sigmoidoscopy):  Excessive amounts of blood in the stool  Significant tenderness or worsening of abdominal pains  Swelling of the abdomen that is new, acute  Fever of 100F or higher    For urgent or emergent issues, a gastroenterologist can be reached at any hour by calling [phone].   DIET:  We do recommend a small meal at first, but then you may proceed to your regular diet.  Drink plenty of fluids but you should avoid alcoholic beverages for 24 hours.  ACTIVITY:  You should plan to take it easy for the rest of today and you should NOT DRIVE or use heavy machinery until tomorrow (because of the sedation medicines used during the test).     FOLLOW UP: Our staff will call the number listed on your records the next business day following your procedure to check on you and address any questions or concerns that you may have regarding the information given to you following your procedure. If we do not reach you, we will leave a message.  However, if you are feeling well and you are not experiencing any problems, there is no need to return our call.  We will assume that you have returned to your regular daily activities without incident.  If any biopsies were taken you will be contacted by phone or by letter within the next 1-3 weeks.  Please call us at [phone] if you have not heard about the biopsies in 3 weeks.    SIGNATURES/CONFIDENTIALITY: You and/or your care partner have signed paperwork which will be entered into your electronic medical record.  These signatures attest to the fact that that the information above on your After Visit Summary has been reviewed and is understood.  Full responsibility of the confidentiality of this discharge information lies with you and/or your care-partner.   INFORMATION ON POLYPS,DIVERTICULOSIS,AND HEMORRHOIDS  GIVEN TO YOU TODAY

## 2016-09-18 NOTE — Progress Notes (Signed)
To PACU VSS, Report to RN 

## 2016-09-18 NOTE — Op Note (Signed)
[facility] Patient Name: Mason [name] Procedure Date: 09/18/2016 12:04 PM MRN: [id number] Endoscopist: Docia Chuck. Henrene Pastor , MD Age: 76 Referring MD:  Date of Birth: 1941-02-01 Gender: Male Account #: [id number] Procedure:                Colonoscopy, with cold snare polypectomy x 1 Indications:              High risk colon cancer surveillance: Personal                            history of multiple (3 or more) adenomas. Previous                            examinations 2002, 2004, 2008, and 2013 Medicines:                Monitored Anesthesia Care Procedure:                Pre-Anesthesia Assessment:                           - Prior to the procedure, a History and Physical                            was performed, and patient medications and                            allergies were reviewed. The patient's tolerance of                            previous anesthesia was also reviewed. The risks                            and benefits of the procedure and the sedation                            options and risks were discussed with the patient.                            All questions were answered, and informed consent                            was obtained. Prior Anticoagulants: The patient has                            taken no previous anticoagulant or antiplatelet                            agents. ASA Grade Assessment: II - A patient with                            mild systemic disease. After reviewing the risks                            and benefits, the patient was deemed in  satisfactory condition to undergo the procedure.                           After obtaining informed consent, the colonoscope                            was passed under direct vision. Throughout the                            procedure, the patient's blood pressure, pulse, and                            oxygen saturations were monitored continuously. The            Colonoscope was introduced through the anus and                            advanced to the the cecum, identified by                            appendiceal orifice and ileocecal valve. The                            ileocecal valve, appendiceal orifice, and rectum                            were photographed. The quality of the bowel                            preparation was good. The colonoscopy was performed                            without difficulty. The patient tolerated the                            procedure well. The bowel preparation used was                            SUPREP. Scope In: 12:17:30 PM Scope Out: 12:31:54 PM Scope Withdrawal Time: 0 hours 10 minutes 31 seconds  Total Procedure Duration: 0 hours 14 minutes 24 seconds  Findings:                 A 1 mm polyp was found in the transverse colon. The                            polyp was removed with a cold snare. Resection and                            retrieval were complete.                           Multiple diverticula were found in the sigmoid  colon.                           Internal hemorrhoids were found during retroflexion.                           The exam was otherwise without abnormality on                            direct and retroflexion views. Complications:            No immediate complications. Estimated blood loss:                            None. Estimated Blood Loss:     Estimated blood loss: none. Impression:               1. Diminutive colon polyp status post polypectomy                           2. Diverticulosis                           3. Otherwise normal exam. Recommendation:           - Repeat colonoscopy in 5 years for surveillance.                           - Patient has a contact number available for                            emergencies. The signs and symptoms of potential                            delayed complications were discussed with the                             patient. Return to normal activities tomorrow.                            Written discharge instructions were provided to the                            patient.                           - Resume previous diet.                           - Continue present medications. Docia Chuck. Henrene Pastor, MD 09/18/2016 12:38:45 PM This report has been signed electronically.

## 2016-09-18 NOTE — Progress Notes (Signed)
Called to room to assist during endoscopic procedure.  Patient ID and intended procedure confirmed with present staff. Received instructions for my participation in the procedure from the performing physician.  

## 2016-09-19 ENCOUNTER — Telehealth: Payer: Self-pay

## 2016-09-19 NOTE — Telephone Encounter (Signed)
  Follow up Call-  Call back number 09/18/2016  Post procedure Call Back phone  # [phone]  Permission to leave phone message Yes  Some recent data might be hidden     Patient questions:  Do you have a fever, pain , or abdominal swelling? No. Pain Score  0 *  Have you tolerated food without any problems? Yes.    Have you been able to return to your normal activities? Yes.    Do you have any questions about your discharge instructions: Diet   No. Medications  No. Follow up visit  No.  Do you have questions or concerns about your Care? No.  Actions: * If pain score is 4 or above: No action needed, pain <4.

## 2016-09-21 ENCOUNTER — Encounter: Payer: Self-pay | Admitting: Internal Medicine

## 2016-11-06 NOTE — Telephone Encounter (Signed)
Called to see if pt wanted to schedule awv - left message.  

## 2017-01-11 ENCOUNTER — Other Ambulatory Visit: Payer: Self-pay | Admitting: Family Medicine

## 2017-01-11 DIAGNOSIS — Z85828 Personal history of other malignant neoplasm of skin: Secondary | ICD-10-CM | POA: Diagnosis not present

## 2017-01-11 DIAGNOSIS — L570 Actinic keratosis: Secondary | ICD-10-CM | POA: Diagnosis not present

## 2017-01-11 DIAGNOSIS — L821 Other seborrheic keratosis: Secondary | ICD-10-CM | POA: Diagnosis not present

## 2017-01-11 DIAGNOSIS — Z08 Encounter for follow-up examination after completed treatment for malignant neoplasm: Secondary | ICD-10-CM | POA: Diagnosis not present

## 2017-01-11 MED ORDER — ROSUVASTATIN CALCIUM 10 MG PO TABS: 10.0000 mg | ORAL_TABLET | Freq: Every day | ORAL | 0 refills | Status: DC

## 2017-01-11 NOTE — Telephone Encounter (Signed)
Refill request for Crestor and send to [facility] in [place] fax number [phone], script was printed and faxed to this number.

## 2017-03-04 ENCOUNTER — Other Ambulatory Visit: Payer: Self-pay | Admitting: Family Medicine

## 2017-03-05 ENCOUNTER — Encounter: Payer: Self-pay | Admitting: Family Medicine

## 2017-03-06 ENCOUNTER — Encounter: Payer: Self-pay | Admitting: Family Medicine

## 2017-03-06 ENCOUNTER — Ambulatory Visit (INDEPENDENT_AMBULATORY_CARE_PROVIDER_SITE_OTHER): Payer: Medicare Other | Admitting: Family Medicine

## 2017-03-06 VITALS — BP 117/86 | HR 64 | Temp 98.3°F | Ht 64.0 in | Wt 161.0 lb

## 2017-03-06 DIAGNOSIS — E039 Hypothyroidism, unspecified: Secondary | ICD-10-CM

## 2017-03-06 DIAGNOSIS — M159 Polyosteoarthritis, unspecified: Secondary | ICD-10-CM

## 2017-03-06 DIAGNOSIS — E782 Mixed hyperlipidemia: Secondary | ICD-10-CM | POA: Diagnosis not present

## 2017-03-06 DIAGNOSIS — R918 Other nonspecific abnormal finding of lung field: Secondary | ICD-10-CM

## 2017-03-06 DIAGNOSIS — R972 Elevated prostate specific antigen [PSA]: Secondary | ICD-10-CM

## 2017-03-06 DIAGNOSIS — K219 Gastro-esophageal reflux disease without esophagitis: Secondary | ICD-10-CM

## 2017-03-06 DIAGNOSIS — Z23 Encounter for immunization: Secondary | ICD-10-CM | POA: Diagnosis not present

## 2017-03-06 DIAGNOSIS — M150 Primary generalized (osteo)arthritis: Secondary | ICD-10-CM | POA: Diagnosis not present

## 2017-03-06 DIAGNOSIS — I10 Essential (primary) hypertension: Secondary | ICD-10-CM

## 2017-03-06 DIAGNOSIS — Z0000 Encounter for general adult medical examination without abnormal findings: Secondary | ICD-10-CM | POA: Diagnosis not present

## 2017-03-06 LAB — CBC WITH DIFFERENTIAL/PLATELET
BASOS PCT: 0.9 % (ref 0.0–3.0)
Basophils Absolute: 0.1 10*3/uL (ref 0.0–0.1)
EOS ABS: 0.1 10*3/uL (ref 0.0–0.7)
EOS PCT: 2.3 % (ref 0.0–5.0)
HEMATOCRIT: 45.2 % (ref 39.0–52.0)
HEMOGLOBIN: 15.7 g/dL (ref 13.0–17.0)
LYMPHS PCT: 32.4 % (ref 12.0–46.0)
Lymphs Abs: 1.8 10*3/uL (ref 0.7–4.0)
MCHC: 34.7 g/dL (ref 30.0–36.0)
MCV: 92.4 fl (ref 78.0–100.0)
Monocytes Absolute: 0.4 10*3/uL (ref 0.1–1.0)
Monocytes Relative: 8 % (ref 3.0–12.0)
NEUTROS ABS: 3.2 10*3/uL (ref 1.4–7.7)
Neutrophils Relative %: 56.4 % (ref 43.0–77.0)
PLATELETS: 212 10*3/uL (ref 150.0–400.0)
RBC: 4.89 Mil/uL (ref 4.22–5.81)
RDW: 12.9 % (ref 11.5–15.5)
WBC: 5.6 10*3/uL (ref 4.0–10.5)

## 2017-03-06 LAB — POC URINALSYSI DIPSTICK (AUTOMATED)
BILIRUBIN UA: NEGATIVE
Glucose, UA: NEGATIVE
Ketones, UA: NEGATIVE
Leukocytes, UA: NEGATIVE
NITRITE UA: NEGATIVE
PH UA: 7 (ref 5.0–8.0)
Protein, UA: NEGATIVE
RBC UA: NEGATIVE
Spec Grav, UA: 1.015 (ref 1.010–1.025)
UROBILINOGEN UA: 0.2 U/dL

## 2017-03-06 LAB — HEPATIC FUNCTION PANEL
ALBUMIN: 4.4 g/dL (ref 3.5–5.2)
ALT: 19 U/L (ref 0–53)
AST: 19 U/L (ref 0–37)
Alkaline Phosphatase: 53 U/L (ref 39–117)
Bilirubin, Direct: 0.2 mg/dL (ref 0.0–0.3)
Total Bilirubin: 0.9 mg/dL (ref 0.2–1.2)
Total Protein: 6.6 g/dL (ref 6.0–8.3)

## 2017-03-06 LAB — BASIC METABOLIC PANEL
BUN: 19 mg/dL (ref 6–23)
CALCIUM: 10 mg/dL (ref 8.4–10.5)
CO2: 27 meq/L (ref 19–32)
Chloride: 103 mEq/L (ref 96–112)
Creatinine, Ser: 1.15 mg/dL (ref 0.40–1.50)
GFR: 66.08 mL/min (ref >60.00)
Glucose, Bld: 88 mg/dL (ref 70–99)
POTASSIUM: 3.8 meq/L (ref 3.5–5.1)
SODIUM: 140 meq/L (ref 135–145)

## 2017-03-06 LAB — LIPID PANEL
CHOLESTEROL: 142 mg/dL (ref 0–200)
HDL: 62 mg/dL (ref >39.00)
LDL CALC: 63 mg/dL (ref 0–99)
NONHDL: 79.91
Total CHOL/HDL Ratio: 2
Triglycerides: 84 mg/dL (ref 0.0–149.0)
VLDL: 16.8 mg/dL (ref 0.0–40.0)

## 2017-03-06 LAB — PSA: PSA: 12.1 ng/mL — AB (ref 0.10–4.00)

## 2017-03-06 LAB — TSH: TSH: 1.59 u[IU]/mL (ref 0.35–4.50)

## 2017-03-06 MED ORDER — ROSUVASTATIN CALCIUM 10 MG PO TABS: 10.0000 mg | ORAL_TABLET | Freq: Every day | ORAL | 3 refills | Status: DC

## 2017-03-06 MED ORDER — LEVOTHYROXINE SODIUM 75 MCG PO TABS: 75.0000 ug | ORAL_TABLET | Freq: Every day | ORAL | 3 refills | Status: DC

## 2017-03-06 MED ORDER — LOSARTAN POTASSIUM-HCTZ 100-25 MG PO TABS: 1.0000 | ORAL_TABLET | Freq: Every day | ORAL | 3 refills | Status: DC

## 2017-03-06 MED ORDER — POTASSIUM CHLORIDE ER 10 MEQ PO TBCR: 10.0000 meq | EXTENDED_RELEASE_TABLET | Freq: Every day | ORAL | 3 refills | Status: DC

## 2017-03-06 NOTE — Progress Notes (Addendum)
Subjective:   Mason [name] is a 76 y.o. male who presents for Medicare Annual/Subsequent preventive examination.  The Patient was informed that the wellness visit is to identify future health risk and educate and initiate measures that can reduce risk for increased disease through the lifespan.    Annual Wellness Assessment  Reports health as   2 children girls Grandchildren 4 Most are in their teens   Preventive Screening -Counseling & Management  Medicare Annual Preventive Care Visit - Subsequent Last OV today  Describes Health as poor, fair, good or great?   VS reviewed;   Tobacco in the past -The abdominal aorta is normal in caliber/ states his dad did have one   Diet  Get up and eat yogurt with granola Eat cereal with high protein or an egg Lunch; meat sandwich; 2 slices of ham or [place] or subway Supper wife cooks Fish, very few red meats Occasional sweets; cookies x 2 per week No ice cream around  BMI 27  Exercise- walks 1.5 miles a day  In [place]; play golf and ride bikes  Back for a couple of months      Cardiac Risk Factors Addressed Hyperlipidemia good medical management Diabetes neg  Advanced Directives completed  Patient Care Team: Laurey Morale, MD as PCP - General Assessed for additional providers  Immunization History  Administered Date(s) Administered  . Influenza Split 04/29/2012  . Influenza Whole 04/25/2007, 03/22/2008, 04/18/2009, 04/18/2010  . Influenza, High Dose Seasonal PF 04/01/2013, 03/05/2016, 03/06/2017  . Influenza,inj,Quad PF,6+ Mos 02/17/2014, 02/21/2015  . Pneumococcal Conjugate-13 02/17/2014  . Pneumococcal Polysaccharide-23 05/04/2008  . Tdap 08/27/2012  . Zoster 05/04/2008   Required Immunizations needed today  Screening test up to date or reviewed for plan of completion Health Maintenance Due  Topic Date Due  . INFLUENZA VACCINE  01/23/2017     Had today by dr. Sarajane Jews   Cardiac Risk Factors include:  advanced age (>84men, >59 women);dyslipidemia;hypertension;male gender     Objective:    Vitals: BP 117/86   Pulse 64   Temp 98.3 F (36.8 C)   Ht 5\' 4"  (1.626 m)   Wt 161 lb (73 kg)   BMI 27.64 kg/m   Body mass index is 27.64 kg/m.  Tobacco History  Smoking Status  . Former Smoker  . Types: Cigarettes  . Quit date: 06/25/1968  Smokeless Tobacco  . Never Used    Comment: not clear but did discuss AAA     Counseling given: Yes   Past Medical History:  Diagnosis Date  . Benign prostatic hypertrophy with elevated PSA    sees Dr. Ivory Broad in [facility]   . GERD (gastroesophageal reflux disease)   . Hyperlipidemia   . Hypertension   . Normal cardiac stress test 09-04-12  . Osteoporosis   . Thyroid disease    hypothyroidism   Past Surgical History:  Procedure Laterality Date  . BASAL CELL CARCINOMA EXCISION  2017   off nose, per Dr. Leilani Merl   . COLONOSCOPY  09/18/2016   per Dr. Henrene Pastor, adenomatous polyp, repeat in 5 yrs  . PROSTATE BIOPSY     x6  . TONSILLECTOMY    . WISDOM TOOTH EXTRACTION     Family History  Problem Relation Age of Onset  . Heart disease Mother   . Heart disease Father   . Colon cancer Brother        late 39's  . Coronary artery disease Unknown  family hx  . Aneurysm Unknown        family hx  . Cancer Unknown        colon  . Colon cancer Unknown        family hx  . Esophageal cancer Neg Hx   . Stomach cancer Neg Hx    History  Sexual Activity  . Sexual activity: Not on file    Outpatient Encounter Prescriptions as of 03/06/2017  Medication Sig  . aspirin 81 MG EC tablet Take 1 tablet (81 mg total) by mouth daily. Swallow whole.  . levothyroxine (SYNTHROID, LEVOTHROID) 75 MCG tablet Take 1 tablet (75 mcg total) by mouth daily.  Marland Kitchen losartan-hydrochlorothiazide (HYZAAR) 100-25 MG tablet Take 1 tablet by mouth daily.  . potassium chloride (KLOR-CON 10) 10 MEQ tablet Take 1 tablet (10 mEq total) by mouth daily.  . rosuvastatin  (CRESTOR) 10 MG tablet Take 1 tablet (10 mg total) by mouth daily.  . [DISCONTINUED] levothyroxine (SYNTHROID, LEVOTHROID) 75 MCG tablet Take 1 tablet (75 mcg total) by mouth daily.  . [DISCONTINUED] losartan-hydrochlorothiazide (HYZAAR) 100-25 MG tablet TAKE ONE TABLET BY MOUTH ONCE DAILY  . [DISCONTINUED] potassium chloride (KLOR-CON 10) 10 MEQ tablet Take 1 tablet (10 mEq total) by mouth daily.  . [DISCONTINUED] rosuvastatin (CRESTOR) 10 MG tablet Take 1 tablet (10 mg total) by mouth daily.   Facility-Administered Encounter Medications as of 03/06/2017  Medication  . 0.9 %  sodium chloride infusion    Activities of Daily Living In your present state of health, do you have any difficulty performing the following activities: 03/06/2017  Hearing? N  Vision? N  Difficulty concentrating or making decisions? N  Walking or climbing stairs? N  Dressing or bathing? N  Doing errands, shopping? N  Preparing Food and eating ? N  Using the Toilet? N  In the past six months, have you accidently leaked urine? N  Do you have problems with loss of bowel control? N  Managing your Medications? N  Managing your Finances? N  Housekeeping or managing your Housekeeping? N  Some recent data might be hidden    Patient Care Team: Laurey Morale, MD as PCP - General   Assessment:     Exercise Activities and Dietary recommendations Current Exercise Habits: Home exercise routine, Type of exercise: walking, Time (Minutes): 30, Frequency (Times/Week): 4, Weekly Exercise (Minutes/Week): 120, Intensity: Moderate  Goals    . Weight (lb) < 155 lb (70.3 kg)          Try to diary your food intake for calorie consumption and energy expenditure   Try to do what you did the last time!      Fall Risk Fall Risk  03/06/2017  Falls in the past year? No   Depression Screen PHQ 2/9 Scores 03/06/2017  PHQ - 2 Score 0    Cognitive Function Ad8 score reviewed for issues:  Issues making decisions:  Less  interest in hobbies / activities:  Repeats questions, stories (family complaining):  Trouble using ordinary gadgets (microwave, computer, phone):  Forgets the month or year:   Mismanaging finances:   Remembering appts:  Daily problems with thinking and/or memory: Ad8 score is=0            Immunization History  Administered Date(s) Administered  . Influenza Split 04/29/2012  . Influenza Whole 04/25/2007, 03/22/2008, 04/18/2009, 04/18/2010  . Influenza, High Dose Seasonal PF 04/01/2013, 03/05/2016, 03/06/2017  . Influenza,inj,Quad PF,6+ Mos 02/17/2014, 02/21/2015  . Pneumococcal Conjugate-13 02/17/2014  .  Pneumococcal Polysaccharide-23 05/04/2008  . Tdap 08/27/2012  . Zoster 05/04/2008   Screening Tests Health Maintenance  Topic Date Due  . INFLUENZA VACCINE  01/23/2017  . COLONOSCOPY  09/18/2021  . TETANUS/TDAP  08/28/2022  . PNA vac Low Risk Adult  Completed      Plan:     PCP Notes   Health Maintenance Had flu vaccine when seeing Dr. Evelena Asa regarding the shingrix.  States he is having a low-dose CT of his lung. History of tobacco use but remote. He cannot quantify the amount he smoked but was not much. Did discuss the preventive AAA with him. States his father had AAA is 54 years of age and had successful surgery. The patient was noted to have a CT of the abdomen of which was reported normal diameter of the aorta.  Abnormal Screens  none  Referrals  none  Patient concerns; CT of the lung Would like to lose a few pounds  Nurse Concerns; none  Next PCP apt Annual visit was today       I have personally reviewed and noted the following in the patient's chart:   . Medical and social history . Use of alcohol, tobacco or illicit drugs  . Current medications and supplements . Functional ability and status . Nutritional status . Physical activity . Advanced directives . List of other physicians . Hospitalizations, surgeries, and ER  visits in previous 12 months . Vitals . Screenings to include cognitive, depression, and falls . Referrals and appointments  In addition, I have reviewed and discussed with patient certain preventive protocols, quality metrics, and best practice recommendations. A written personalized care plan for preventive services as well as general preventive health recommendations were provided to patient.     MWUXL,KGMWN, RN  03/06/2017  I have reviewed this note and agree with its contents.  Alysia Penna, MD

## 2017-03-06 NOTE — Patient Instructions (Addendum)
WE NOW OFFER   [facility] FAST TRACK!!!  SAME DAY Appointments for ACUTE CARE  Such as: Sprains, Injuries, cuts, abrasions, rashes, muscle pain, joint pain, back pain Colds, flu, sore throats, headache, allergies, cough, fever  Ear pain, sinus and eye infections Abdominal pain, nausea, vomiting, diarrhea, upset stomach Animal/insect bites  3 Easy Ways to Schedule: Walk-In Scheduling Call in scheduling Mychart Sign-up: [email or web address]    [name] , Thank you for taking time to come for your Medicare Wellness Visit. I appreciate your ongoing commitment to your health goals. Please review the following plan we discussed and let me know if I can assist you in the future.   Check out  online nutrition programs as GumSearch.nl and http://vang.com/; fit3me; Look for foods with "whole" wheat; bran; oatmeal etc Shot at the farmer's markets in season for fresher choices  Watch for "hydrogenated" on the label of oils which are trans-fats.  Watch for "high fructose corn syrup" in snacks, yogurt or ketchup  Meats have less marbling; bright colored fruits and vegetables;  Canned; dump out liquid and wash vegetables. Be mindful of what we are eating  Portion control is essential to a health weight! Sit down; take a break and enjoy your meal; take smaller bites; put the fork down between bites;  It takes 20 minutes to get full; so check in with your fullness cues and stop eating when you start to fill full    Had flu vaccine today   Educated regarding the shingrix Shingrix is a vaccine for the prevention of Shingles in Adults 50 and older.  If you are on Medicare, you can request a prescription from your doctor to be filled at a pharmacy.  Please check with your benefits regarding applicable copays or out of pocket expenses.  The Shingrix is given in 2 vaccines approx 8 weeks apart. You must receive the 2nd dose prior to 6 months from receipt of the first.       These are the goals we discussed: Goals    . Weight (lb) < 155 lb (70.3 kg)          Try to diary your food intake for calorie consumption and energy expenditure   Try to do what you did the last time!       This is a list of the screening recommended for you and due dates:  Health Maintenance  Topic Date Due  . Flu Shot  01/23/2017  . Colon Cancer Screening  09/18/2021  . Tetanus Vaccine  08/28/2022  . Pneumonia vaccines  Completed     Prevention of falls: Remove rugs or any tripping hazards in the home Use Non slip mats in bathtubs and showers Placing grab bars next to the toilet and or shower Placing handrails on both sides of the stair way Adding extra lighting in the home.   Personal safety issues reviewed:  1. Consider starting a community watch program per [employer] 2.  Changes batteries is smoke detector and/or carbon monoxide detector  3.  If you have firearms; keep them in a safe place 4.  Wear protection when in the sun; Always wear sunscreen or a hat; It is good to have your doctor check your skin annually or review any new areas of concern 5. Driving safety; Keep in the right lane; stay 3 car lengths behind the car in front of you on the highway; look 3 times prior to pulling out; carry your cell phone everywhere you go!  Learn about the Yellow Dot program:  The program allows first responders at your emergency to have access to who your physician is, as well as your medications and medical conditions.  Citizens requesting the Yellow Dot Packages should contact Master Corporal Nunzio Cobbs at the [facility] [phone] for the first week of the program and beginning the week after Easter citizens should contact their Scientist, physiological.   Health Maintenance, Male A healthy lifestyle and preventive care is important for your health and wellness. Ask your health care provider about what schedule of regular  examinations is right for you. What should I know about weight and diet? Eat a Healthy Diet  Eat plenty of vegetables, fruits, whole grains, low-fat dairy products, and lean protein.  Do not eat a lot of foods high in solid fats, added sugars, or salt.  Maintain a Healthy Weight Regular exercise can help you achieve or maintain a healthy weight. You should:  Do at least 150 minutes of exercise each week. The exercise should increase your heart rate and make you sweat (moderate-intensity exercise).  Do strength-training exercises at least twice a week.  Watch Your Levels of Cholesterol and Blood Lipids  Have your blood tested for lipids and cholesterol every 5 years starting at 74 years of age. If you are at high risk for heart disease, you should start having your blood tested when you are 74 years old. You may need to have your cholesterol levels checked more often if: ? Your lipid or cholesterol levels are high. ? You are older than 74 years of age. ? You are at high risk for heart disease.  What should I know about cancer screening? Many types of cancers can be detected early and may often be prevented. Lung Cancer  You should be screened every year for lung cancer if: ? You are a current smoker who has smoked for at least 30 years. ? You are a former smoker who has quit within the past 15 years.  Talk to your health care provider about your screening options, when you should start screening, and how often you should be screened.  Colorectal Cancer  Routine colorectal cancer screening usually begins at 74 years of age and should be repeated every 5-10 years until you are 74 years old. You may need to be screened more often if early forms of precancerous polyps or small growths are found. Your health care provider may recommend screening at an earlier age if you have risk factors for colon cancer.  Your health care provider may recommend using home test kits to check for hidden  blood in the stool.  A small camera at the end of a tube can be used to examine your colon (sigmoidoscopy or colonoscopy). This checks for the earliest forms of colorectal cancer.  Prostate and Testicular Cancer  Depending on your age and overall health, your health care provider may do certain tests to screen for prostate and testicular cancer.  Talk to your health care provider about any symptoms or concerns you have about testicular or prostate cancer.  Skin Cancer  Check your skin from head to toe regularly.  Tell your health care provider about any new moles or changes in moles, especially if: ? There is a change in a mole's size, shape, or color. ? You have a mole that is larger than a pencil eraser.  Always use sunscreen. Apply sunscreen liberally and repeat throughout the day.  Protect yourself by  wearing long sleeves, pants, a wide-brimmed hat, and sunglasses when outside.  What should I know about heart disease, diabetes, and high blood pressure?  If you are 92-54 years of age, have your blood pressure checked every 3-5 years. If you are 40 years of age or older, have your blood pressure checked every year. You should have your blood pressure measured twice-once when you are at a hospital or clinic, and once when you are not at a hospital or clinic. Record the average of the two measurements. To check your blood pressure when you are not at a hospital or clinic, you can use: ? An automated blood pressure machine at a pharmacy. ? A home blood pressure monitor.  Talk to your health care provider about your target blood pressure.  If you are between 57-22 years old, ask your health care provider if you should take aspirin to prevent heart disease.  Have regular diabetes screenings by checking your fasting blood sugar level. ? If you are at a normal weight and have a low risk for diabetes, have this test once every three years after the age of 61. ? If you are overweight and  have a high risk for diabetes, consider being tested at a younger age or more often.  A one-time screening for abdominal aortic aneurysm (AAA) by ultrasound is recommended for men aged 35-75 years who are current or former smokers. What should I know about preventing infection? Hepatitis B If you have a higher risk for hepatitis B, you should be screened for this virus. Talk with your health care provider to find out if you are at risk for hepatitis B infection. Hepatitis C Blood testing is recommended for:  Everyone born from [birth year] through 1963.  Anyone with known risk factors for hepatitis C.  Sexually Transmitted Diseases (STDs)  You should be screened each year for STDs including gonorrhea and chlamydia if: ? You are sexually active and are younger than 74 years of age. ? You are older than 74 years of age and your health care provider tells you that you are at risk for this type of infection. ? Your sexual activity has changed since you were last screened and you are at an increased risk for chlamydia or gonorrhea. Ask your health care provider if you are at risk.  Talk with your health care provider about whether you are at high risk of being infected with HIV. Your health care provider may recommend a prescription medicine to help prevent HIV infection.  What else can I do?  Schedule regular health, dental, and eye exams.  Stay current with your vaccines (immunizations).  Do not use any tobacco products, such as cigarettes, chewing tobacco, and e-cigarettes. If you need help quitting, ask your health care provider.  Limit alcohol intake to no more than 2 drinks per day. One drink equals 12 ounces of beer, 5 ounces of wine, or 1 ounces of hard liquor.  Do not use street drugs.  Do not share needles.  Ask your health care provider for help if you need support or information about quitting drugs.  Tell your health care provider if you often feel depressed.  Tell your  health care provider if you have ever been abused or do not feel safe at home. This information is not intended to replace advice given to you by your health care provider. Make sure you discuss any questions you have with your health care provider. Document Released: 12/08/2007 Document Revised: 02/08/2016 Document  Reviewed: 03/15/2015 Elsevier Interactive Patient Education  2018 [place] Prevention in the Home Falls can cause injuries and can affect people from all age groups. There are many simple things that you can do to make your home safe and to help prevent falls. What can I do on the outside of my home?  Regularly repair the edges of walkways and driveways and fix any cracks.  Remove high doorway thresholds.  Trim any shrubbery on the main path into your home.  Use bright outdoor lighting.  Clear walkways of debris and clutter, including tools and rocks.  Regularly check that handrails are securely fastened and in good repair. Both sides of any steps should have handrails.  Install guardrails along the edges of any raised decks or porches.  Have leaves, snow, and ice cleared regularly.  Use sand or salt on walkways during winter months.  In the garage, clean up any spills right away, including grease or oil spills. What can I do in the bathroom?  Use night lights.  Install grab bars by the toilet and in the tub and shower. Do not use towel bars as grab bars.  Use non-skid mats or decals on the floor of the tub or shower.  If you need to sit down while you are in the shower, use a plastic, non-slip stool.  Keep the floor dry. Immediately clean up any water that spills on the floor.  Remove soap buildup in the tub or shower on a regular basis.  Attach bath mats securely with double-sided non-slip rug tape.  Remove throw rugs and other tripping hazards from the floor. What can I do in the bedroom?  Use night lights.  Make sure that a bedside  light is easy to reach.  Do not use oversized bedding that drapes onto the floor.  Have a firm chair that has side arms to use for getting dressed.  Remove throw rugs and other tripping hazards from the floor. What can I do in the kitchen?  Clean up any spills right away.  Avoid walking on wet floors.  Place frequently used items in easy-to-reach places.  If you need to reach for something above you, use a sturdy step stool that has a grab bar.  Keep electrical cables out of the way.  Do not use floor polish or wax that makes floors slippery. If you have to use wax, make sure that it is non-skid floor wax.  Remove throw rugs and other tripping hazards from the floor. What can I do in the stairways?  Do not leave any items on the stairs.  Make sure that there are handrails on both sides of the stairs. Fix handrails that are broken or loose. Make sure that handrails are as long as the stairways.  Check any carpeting to make sure that it is firmly attached to the stairs. Fix any carpet that is loose or worn.  Avoid having throw rugs at the top or bottom of stairways, or secure the rugs with carpet tape to prevent them from moving.  Make sure that you have a light switch at the top of the stairs and the bottom of the stairs. If you do not have them, have them installed. What are some other fall prevention tips?  Wear closed-toe shoes that fit well and support your feet. Wear shoes that have rubber soles or low heels.  When you use a stepladder, make sure that it is completely opened and that  the sides are firmly locked. Have someone hold the ladder while you are using it. Do not climb a closed stepladder.  Add color or contrast paint or tape to grab bars and handrails in your home. Place contrasting color strips on the first and last steps.  Use mobility aids as needed, such as canes, walkers, scooters, and crutches.  Turn on lights if it is dark. Replace any light bulbs that  burn out.  Set up furniture so that there are clear paths. Keep the furniture in the same spot.  Fix any uneven floor surfaces.  Choose a carpet design that does not hide the edge of steps of a stairway.  Be aware of any and all pets.  Review your medicines with your healthcare provider. Some medicines can cause dizziness or changes in blood pressure, which increase your risk of falling. Talk with your health care provider about other ways that you can decrease your risk of falls. This may include working with a physical therapist or trainer to improve your strength, balance, and endurance. This information is not intended to replace advice given to you by your health care provider. Make sure you discuss any questions you have with your health care provider. Document Released: 06/01/2002 Document Revised: 11/08/2015 Document Reviewed: 07/16/2014 Elsevier Interactive Patient Education  2017 [employer].

## 2017-03-06 NOTE — Progress Notes (Signed)
   Subjective:    Patient ID: Mason [name], male    DOB: 06-13-1941, 76 y.o.   MRN: [id number]  HPI Here to follow up issues. He feels great in general. He spends half of each year in [place] and last October he had an episode of sudden onset headache and dizziness. He was hospitalized for 2 days and recovered completely. No etiology was ever found. His workup at that time included a normal CXR, a normal contrasted brain CT scan, and a normal CT angiogram of the carotids. An ECHO was normal with an EF of 55-65%. However a chest CT scan revealed numerous tiny lung nodules, the largest of which measured 6 mm. It was recommended to him to have a follow up chest CT in 3-6 months but he has not done so. He denies any chest symptoms. He still sees his Urologist every 6 months to follow elevated PSA counts. Several prostate biopsies have all been negative.    Review of Systems  Constitutional: Negative.   HENT: Negative.   Eyes: Negative.   Respiratory: Negative.   Cardiovascular: Negative.   Gastrointestinal: Negative.   Genitourinary: Negative.   Musculoskeletal: Negative.   Skin: Negative.   Neurological: Negative.   Psychiatric/Behavioral: Negative.        Objective:   Physical Exam  Constitutional: He is oriented to person, place, and time. He appears well-developed and well-nourished. No distress.  HENT:  Head: Normocephalic and atraumatic.  Right Ear: External ear normal.  Left Ear: External ear normal.  Nose: Nose normal.  Mouth/Throat: Oropharynx is clear and moist. No oropharyngeal exudate.  Eyes: Pupils are equal, round, and reactive to light. Conjunctivae and EOM are normal. Right eye exhibits no discharge. Left eye exhibits no discharge. No scleral icterus.  Neck: Neck supple. No JVD present. No tracheal deviation present. No thyromegaly present.  Cardiovascular: Normal rate, regular rhythm, normal heart sounds and intact distal pulses.  Exam reveals no gallop and no  friction rub.   No murmur heard. Pulmonary/Chest: Effort normal and breath sounds normal. No respiratory distress. He has no wheezes. He has no rales. He exhibits no tenderness.  Abdominal: Soft. Bowel sounds are normal. He exhibits no distension and no mass. There is no tenderness. There is no rebound and no guarding.  Musculoskeletal: Normal range of motion. He exhibits no edema or tenderness.  Lymphadenopathy:    He has no cervical adenopathy.  Neurological: He is alert and oriented to person, place, and time. He has normal reflexes. No cranial nerve deficit. He exhibits normal muscle tone. Coordination normal.  Skin: Skin is warm and dry. No rash noted. He is not diaphoretic. No erythema. No pallor.  Psychiatric: He has a normal mood and affect. His behavior is normal. Judgment and thought content normal.          Assessment & Plan:  His HTN is stable. We will get fasting labs today to follow his lipids, etc. Check another PSA. His GERD is stable. Check a TSH to follow his thyroid level. We will set up another chest CT to follow up on the lung nodules.  Alysia Penna, MD

## 2017-03-14 ENCOUNTER — Encounter: Payer: Self-pay | Admitting: Family Medicine

## 2017-03-15 ENCOUNTER — Ambulatory Visit (INDEPENDENT_AMBULATORY_CARE_PROVIDER_SITE_OTHER)
Admission: RE | Admit: 2017-03-15 | Discharge: 2017-03-15 | Disposition: A | Discharge status: Home / Self Care | Payer: Medicare Other | Source: Ambulatory Visit | Attending: Family Medicine | Admitting: Family Medicine

## 2017-03-15 DIAGNOSIS — R918 Other nonspecific abnormal finding of lung field: Secondary | ICD-10-CM | POA: Diagnosis not present

## 2017-03-18 ENCOUNTER — Other Ambulatory Visit: Payer: Self-pay

## 2017-03-18 ENCOUNTER — Ambulatory Visit
Admission: RE | Admit: 2017-03-18 | Discharge: 2017-03-18 | Disposition: A | Discharge status: Home / Self Care | Payer: Self-pay | Source: Ambulatory Visit | Attending: Family Medicine | Admitting: Family Medicine

## 2017-03-18 ENCOUNTER — Other Ambulatory Visit: Payer: Self-pay | Admitting: Family Medicine

## 2017-03-18 DIAGNOSIS — R0602 Shortness of breath: Secondary | ICD-10-CM

## 2017-07-05 ENCOUNTER — Encounter: Payer: Self-pay | Admitting: Family Medicine

## 2017-07-05 DIAGNOSIS — M795 Residual foreign body in soft tissue: Secondary | ICD-10-CM

## 2017-07-05 NOTE — Telephone Encounter (Signed)
This would be either Orthopedics or General Surgery. First we need a basic Xray of the shoulder to see where this object is. I put in an order for this at [facility], so he can stop by to have it taken. After I get the report back I will decide who he should see next

## 2017-07-08 ENCOUNTER — Ambulatory Visit (INDEPENDENT_AMBULATORY_CARE_PROVIDER_SITE_OTHER)
Admission: RE | Admit: 2017-07-08 | Discharge: 2017-07-08 | Disposition: A | Discharge status: Home / Self Care | Payer: Medicare Other | Source: Ambulatory Visit | Attending: Family Medicine | Admitting: Family Medicine

## 2017-07-08 DIAGNOSIS — M19012 Primary osteoarthritis, left shoulder: Secondary | ICD-10-CM | POA: Diagnosis not present

## 2017-07-08 DIAGNOSIS — M795 Residual foreign body in soft tissue: Secondary | ICD-10-CM | POA: Diagnosis not present

## 2017-08-07 DIAGNOSIS — J206 Acute bronchitis due to rhinovirus: Secondary | ICD-10-CM | POA: Diagnosis not present

## 2017-08-07 DIAGNOSIS — J321 Chronic frontal sinusitis: Secondary | ICD-10-CM | POA: Diagnosis not present

## 2017-08-19 ENCOUNTER — Encounter: Payer: Self-pay | Admitting: Family Medicine

## 2017-08-29 ENCOUNTER — Encounter: Payer: Self-pay | Admitting: Family Medicine

## 2017-08-29 ENCOUNTER — Ambulatory Visit (INDEPENDENT_AMBULATORY_CARE_PROVIDER_SITE_OTHER): Payer: Medicare Other | Admitting: Family Medicine

## 2017-08-29 VITALS — BP 118/72 | HR 66 | Temp 98.1°F | Wt 166.2 lb

## 2017-08-29 DIAGNOSIS — M795 Residual foreign body in soft tissue: Secondary | ICD-10-CM | POA: Diagnosis not present

## 2017-08-29 NOTE — Progress Notes (Signed)
   Subjective:    Patient ID: Mason [name], male    DOB: November 29, 1940, 77 y.o.   MRN: [id number]  HPI Here to discuss removing a foreign body from the left shoulder. He saw Dr. Tamera Punt for this in 2013 and an Xrays revealed a 2-4 mm metallic object in the soft tissues. It was decided not to remove it at that time, but now the patient wants it removed. It does not cause any pain or restriction of movement. His urologist is following him for elevated PSA readings and he has said he would like to get an MRI of the prostate. This cannot be done however as long as the object is in the shoulder.    Review of Systems  Constitutional: Negative.   Cardiovascular: Negative.   Musculoskeletal: Negative.        Objective:   Physical Exam  Constitutional: He appears well-developed and well-nourished.  Cardiovascular: Normal rate, regular rhythm, normal heart sounds and intact distal pulses.  Pulmonary/Chest: Effort normal and breath sounds normal. No respiratory distress. He has no wheezes. He has no rales.  Musculoskeletal:  Left shoulder is normal, full ROM, not tender           Assessment & Plan:  Foreign body in the left shoulder. We will refer him back to Dr. Tamera Punt. Alysia Penna, MD

## 2017-09-13 DIAGNOSIS — M25512 Pain in left shoulder: Secondary | ICD-10-CM | POA: Diagnosis not present

## 2017-09-16 DIAGNOSIS — H2513 Age-related nuclear cataract, bilateral: Secondary | ICD-10-CM | POA: Diagnosis not present

## 2017-09-16 DIAGNOSIS — R972 Elevated prostate specific antigen [PSA]: Secondary | ICD-10-CM | POA: Diagnosis not present

## 2017-09-16 DIAGNOSIS — D3131 Benign neoplasm of right choroid: Secondary | ICD-10-CM | POA: Diagnosis not present

## 2017-09-16 DIAGNOSIS — R358 Other polyuria: Secondary | ICD-10-CM | POA: Diagnosis not present

## 2017-09-16 DIAGNOSIS — R339 Retention of urine, unspecified: Secondary | ICD-10-CM | POA: Diagnosis not present

## 2017-10-02 DIAGNOSIS — Z85828 Personal history of other malignant neoplasm of skin: Secondary | ICD-10-CM | POA: Diagnosis not present

## 2017-10-02 DIAGNOSIS — D225 Melanocytic nevi of trunk: Secondary | ICD-10-CM | POA: Diagnosis not present

## 2017-10-02 DIAGNOSIS — Z08 Encounter for follow-up examination after completed treatment for malignant neoplasm: Secondary | ICD-10-CM | POA: Diagnosis not present

## 2017-10-02 DIAGNOSIS — D235 Other benign neoplasm of skin of trunk: Secondary | ICD-10-CM | POA: Diagnosis not present

## 2017-10-02 DIAGNOSIS — L821 Other seborrheic keratosis: Secondary | ICD-10-CM | POA: Diagnosis not present

## 2017-10-02 DIAGNOSIS — L814 Other melanin hyperpigmentation: Secondary | ICD-10-CM | POA: Diagnosis not present

## 2017-10-02 DIAGNOSIS — D485 Neoplasm of uncertain behavior of skin: Secondary | ICD-10-CM | POA: Diagnosis not present

## 2018-02-03 DIAGNOSIS — M795 Residual foreign body in soft tissue: Secondary | ICD-10-CM | POA: Diagnosis not present

## 2018-02-03 DIAGNOSIS — Z1811 Retained magnetic metal fragments: Secondary | ICD-10-CM | POA: Diagnosis not present

## 2018-03-07 ENCOUNTER — Encounter: Payer: Self-pay | Admitting: Family Medicine

## 2018-03-07 ENCOUNTER — Ambulatory Visit (INDEPENDENT_AMBULATORY_CARE_PROVIDER_SITE_OTHER): Payer: Medicare Other | Admitting: Family Medicine

## 2018-03-07 VITALS — BP 110/66 | HR 70 | Temp 98.0°F | Ht 63.5 in | Wt 159.2 lb

## 2018-03-07 DIAGNOSIS — Z23 Encounter for immunization: Secondary | ICD-10-CM

## 2018-03-07 DIAGNOSIS — K219 Gastro-esophageal reflux disease without esophagitis: Secondary | ICD-10-CM

## 2018-03-07 DIAGNOSIS — M150 Primary generalized (osteo)arthritis: Secondary | ICD-10-CM | POA: Diagnosis not present

## 2018-03-07 DIAGNOSIS — I10 Essential (primary) hypertension: Secondary | ICD-10-CM | POA: Diagnosis not present

## 2018-03-07 DIAGNOSIS — R918 Other nonspecific abnormal finding of lung field: Secondary | ICD-10-CM

## 2018-03-07 DIAGNOSIS — E782 Mixed hyperlipidemia: Secondary | ICD-10-CM

## 2018-03-07 DIAGNOSIS — R0602 Shortness of breath: Secondary | ICD-10-CM | POA: Diagnosis not present

## 2018-03-07 DIAGNOSIS — E039 Hypothyroidism, unspecified: Secondary | ICD-10-CM | POA: Diagnosis not present

## 2018-03-07 DIAGNOSIS — M159 Polyosteoarthritis, unspecified: Secondary | ICD-10-CM

## 2018-03-07 LAB — POC URINALSYSI DIPSTICK (AUTOMATED)
BILIRUBIN UA: NEGATIVE
GLUCOSE UA: NEGATIVE
KETONES UA: NEGATIVE
Leukocytes, UA: NEGATIVE
NITRITE UA: NEGATIVE
PH UA: 7 (ref 5.0–8.0)
Protein, UA: NEGATIVE
RBC UA: NEGATIVE
Spec Grav, UA: 1.015 (ref 1.010–1.025)
Urobilinogen, UA: 0.2 E.U./dL

## 2018-03-07 LAB — BASIC METABOLIC PANEL
BUN: 19 mg/dL (ref 6–23)
CHLORIDE: 104 meq/L (ref 96–112)
CO2: 28 meq/L (ref 19–32)
Calcium: 9.7 mg/dL (ref 8.4–10.5)
Creatinine, Ser: 1.1 mg/dL (ref 0.40–1.50)
GFR: 69.37 mL/min (ref >60.00)
GLUCOSE: 87 mg/dL (ref 70–99)
POTASSIUM: 3.6 meq/L (ref 3.5–5.1)
SODIUM: 141 meq/L (ref 135–145)

## 2018-03-07 LAB — CBC WITH DIFFERENTIAL/PLATELET
BASOS PCT: 1 % (ref 0.0–3.0)
Basophils Absolute: 0.1 10*3/uL (ref 0.0–0.1)
EOS ABS: 0.1 10*3/uL (ref 0.0–0.7)
Eosinophils Relative: 2.3 % (ref 0.0–5.0)
HEMATOCRIT: 44.7 % (ref 39.0–52.0)
Hemoglobin: 15.6 g/dL (ref 13.0–17.0)
LYMPHS ABS: 1.6 10*3/uL (ref 0.7–4.0)
LYMPHS PCT: 29.7 % (ref 12.0–46.0)
MCHC: 35 g/dL (ref 30.0–36.0)
MCV: 90.9 fl (ref 78.0–100.0)
MONOS PCT: 7.5 % (ref 3.0–12.0)
Monocytes Absolute: 0.4 10*3/uL (ref 0.1–1.0)
NEUTROS ABS: 3.1 10*3/uL (ref 1.4–7.7)
Neutrophils Relative %: 59.5 % (ref 43.0–77.0)
Platelets: 212 10*3/uL (ref 150.0–400.0)
RBC: 4.91 Mil/uL (ref 4.22–5.81)
RDW: 13.1 % (ref 11.5–15.5)
WBC: 5.2 10*3/uL (ref 4.0–10.5)

## 2018-03-07 LAB — TSH: TSH: 1.68 u[IU]/mL (ref 0.35–4.50)

## 2018-03-07 LAB — HEPATIC FUNCTION PANEL
ALBUMIN: 4.4 g/dL (ref 3.5–5.2)
ALK PHOS: 56 U/L (ref 39–117)
ALT: 18 U/L (ref 0–53)
AST: 17 U/L (ref 0–37)
Bilirubin, Direct: 0.2 mg/dL (ref 0.0–0.3)
Total Bilirubin: 0.9 mg/dL (ref 0.2–1.2)
Total Protein: 6.8 g/dL (ref 6.0–8.3)

## 2018-03-07 LAB — LIPID PANEL
CHOLESTEROL: 146 mg/dL (ref 0–200)
HDL: 49 mg/dL (ref >39.00)
LDL Cholesterol: 79 mg/dL (ref 0–99)
NonHDL: 97.2
TRIGLYCERIDES: 89 mg/dL (ref 0.0–149.0)
Total CHOL/HDL Ratio: 3
VLDL: 17.8 mg/dL (ref 0.0–40.0)

## 2018-03-07 MED ORDER — POTASSIUM CHLORIDE ER 10 MEQ PO TBCR: 10.0000 meq | EXTENDED_RELEASE_TABLET | Freq: Every day | ORAL | 3 refills | Status: DC

## 2018-03-07 MED ORDER — ROSUVASTATIN CALCIUM 10 MG PO TABS: 10.0000 mg | ORAL_TABLET | Freq: Every day | ORAL | 3 refills | Status: DC

## 2018-03-07 MED ORDER — LOSARTAN POTASSIUM-HCTZ 100-25 MG PO TABS: 1.0000 | ORAL_TABLET | Freq: Every day | ORAL | 3 refills | Status: DC

## 2018-03-07 MED ORDER — LEVOTHYROXINE SODIUM 75 MCG PO TABS: 75.0000 ug | ORAL_TABLET | Freq: Every day | ORAL | 3 refills | Status: DC

## 2018-03-07 NOTE — Progress Notes (Signed)
   Subjective:    Patient ID: Mason [name], male    DOB: 07/28/1940, 77 y.o.   MRN: [id number]  HPI Here to follow up on issues. He feels great. His GERD is controlled with diet. His BP is stable at home. Last year we ordered a chest CT to evaluate some SOB he was having and we have bee following some lung nodules. A 12 month follow up CT was recommended since he is a former smoker. He sees Dr. Orland Mustard regularly for elevated PSA levels.    Review of Systems  Constitutional: Negative.   HENT: Negative.   Eyes: Negative.   Respiratory: Negative.   Cardiovascular: Negative.   Gastrointestinal: Negative.   Genitourinary: Negative.   Musculoskeletal: Negative.   Skin: Negative.   Neurological: Negative.   Psychiatric/Behavioral: Negative.        Objective:   Physical Exam  Constitutional: He is oriented to person, place, and time. He appears well-developed and well-nourished. No distress.  HENT:  Head: Normocephalic and atraumatic.  Right Ear: External ear normal.  Left Ear: External ear normal.  Nose: Nose normal.  Mouth/Throat: Oropharynx is clear and moist. No oropharyngeal exudate.  Eyes: Pupils are equal, round, and reactive to light. Conjunctivae and EOM are normal. Right eye exhibits no discharge. Left eye exhibits no discharge. No scleral icterus.  Neck: Neck supple. No JVD present. No tracheal deviation present. No thyromegaly present.  Cardiovascular: Normal rate, regular rhythm, normal heart sounds and intact distal pulses. Exam reveals no gallop and no friction rub.  No murmur heard. Pulmonary/Chest: Effort normal and breath sounds normal. No respiratory distress. He has no wheezes. He has no rales. He exhibits no tenderness.  Abdominal: Soft. Bowel sounds are normal. He exhibits no distension and no mass. There is no tenderness. There is no rebound and no guarding.  Musculoskeletal: Normal range of motion. He exhibits no edema or tenderness.  Lymphadenopathy:   He has no cervical adenopathy.  Neurological: He is alert and oriented to person, place, and time. He has normal reflexes. He displays normal reflexes. No cranial nerve deficit. He exhibits normal muscle tone. Coordination normal.  Skin: Skin is warm and dry. No rash noted. He is not diaphoretic. No erythema. No pallor.  Psychiatric: He has a normal mood and affect. His behavior is normal. Judgment and thought content normal.          Assessment & Plan:  His HTN is stable. His GERD is stable. His SOB is stable. We will set up a non-contrasted chest CT to follow up the lung nodules. Get fasting labs to check lipids, TSH, etc.  Alysia Penna, MD

## 2018-03-10 ENCOUNTER — Encounter: Payer: Self-pay | Admitting: *Deleted

## 2018-03-17 DIAGNOSIS — R358 Other polyuria: Secondary | ICD-10-CM | POA: Diagnosis not present

## 2018-03-17 DIAGNOSIS — R972 Elevated prostate specific antigen [PSA]: Secondary | ICD-10-CM | POA: Diagnosis not present

## 2018-03-20 ENCOUNTER — Ambulatory Visit (INDEPENDENT_AMBULATORY_CARE_PROVIDER_SITE_OTHER)
Admission: RE | Admit: 2018-03-20 | Discharge: 2018-03-20 | Disposition: A | Discharge status: Home / Self Care | Payer: Medicare Other | Source: Ambulatory Visit | Attending: Family Medicine | Admitting: Family Medicine

## 2018-03-20 DIAGNOSIS — R918 Other nonspecific abnormal finding of lung field: Secondary | ICD-10-CM

## 2018-04-09 DIAGNOSIS — D225 Melanocytic nevi of trunk: Secondary | ICD-10-CM | POA: Diagnosis not present

## 2018-04-09 DIAGNOSIS — Z08 Encounter for follow-up examination after completed treatment for malignant neoplasm: Secondary | ICD-10-CM | POA: Diagnosis not present

## 2018-04-09 DIAGNOSIS — D485 Neoplasm of uncertain behavior of skin: Secondary | ICD-10-CM | POA: Diagnosis not present

## 2018-04-09 DIAGNOSIS — Z85828 Personal history of other malignant neoplasm of skin: Secondary | ICD-10-CM | POA: Diagnosis not present

## 2018-04-09 DIAGNOSIS — L821 Other seborrheic keratosis: Secondary | ICD-10-CM | POA: Diagnosis not present

## 2018-04-09 DIAGNOSIS — L111 Transient acantholytic dermatosis [Grover]: Secondary | ICD-10-CM | POA: Diagnosis not present

## 2018-04-09 DIAGNOSIS — L814 Other melanin hyperpigmentation: Secondary | ICD-10-CM | POA: Diagnosis not present

## 2018-05-27 ENCOUNTER — Telehealth: Payer: Self-pay | Admitting: Family Medicine

## 2018-05-27 MED ORDER — LOSARTAN POTASSIUM 100 MG PO TABS: 100.0000 mg | ORAL_TABLET | Freq: Every day | ORAL | 3 refills | Status: DC

## 2018-05-27 MED ORDER — HYDROCHLOROTHIAZIDE 25 MG PO TABS: 25.0000 mg | ORAL_TABLET | Freq: Every day | ORAL | 3 refills | Status: DC

## 2018-05-27 NOTE — Telephone Encounter (Signed)
Medications have been changed and sent to the pharmacy.

## 2018-05-27 NOTE — Telephone Encounter (Signed)
Copied from [place] [phone]. Topic: Quick Communication - See Telephone Encounter >> May 27, 2018 11:17 AM Conception Chancy, NT wrote: CRM for notification. See Telephone encounter for: 05/27/18.  Patient is calling and states his pharmacy in [place] does not have losartan-hydrochlorothiazide (HYZAAR) 100-25 MG tablet  and no pharmacy around him knows when they will be in stock. He is needing a replacement sent in.  [place], [place] - [address] [place] [address] Phone: [phone] Fax: [phone]

## 2018-06-13 DIAGNOSIS — J0101 Acute recurrent maxillary sinusitis: Secondary | ICD-10-CM | POA: Diagnosis not present

## 2018-08-14 ENCOUNTER — Telehealth: Payer: Self-pay

## 2018-08-14 NOTE — Telephone Encounter (Signed)
Author phoned pt. to set up AWV, but realized that appointment had already been scheduled for 2/27. No follow up needed at this time.  Copied from [place] [phone]. Topic: Medicare AWV >> Aug 14, 2018  9:18 AM Judyann Munson wrote: Reason for CRM:  patient is calling to schedule his AWV appt. The best contact number is [phone]

## 2018-08-21 ENCOUNTER — Ambulatory Visit (INDEPENDENT_AMBULATORY_CARE_PROVIDER_SITE_OTHER): Payer: Medicare Other | Admitting: Family Medicine

## 2018-08-21 ENCOUNTER — Ambulatory Visit (INDEPENDENT_AMBULATORY_CARE_PROVIDER_SITE_OTHER): Payer: Medicare Other

## 2018-08-21 ENCOUNTER — Encounter: Payer: Self-pay | Admitting: Family Medicine

## 2018-08-21 VITALS — BP 122/76 | HR 64 | Temp 97.8°F | Ht 64.0 in | Wt 161.0 lb

## 2018-08-21 VITALS — BP 122/76 | HR 64 | Temp 97.8°F | Resp 14 | Ht 64.0 in | Wt 161.0 lb

## 2018-08-21 DIAGNOSIS — R1310 Dysphagia, unspecified: Secondary | ICD-10-CM

## 2018-08-21 DIAGNOSIS — Z0000 Encounter for general adult medical examination without abnormal findings: Secondary | ICD-10-CM

## 2018-08-21 NOTE — Progress Notes (Addendum)
Subjective:   Mason [name] is a 78 y.o. male who presents for Medicare Annual/Subsequent preventive examination.  Review of Systems:  No ROS.  Medicare Wellness Visit. Additional risk factors are reflected in the social history.  Cardiac Risk Factors include: advanced age (>59men, >8 women);hypertension;dyslipidemia;male gender Sleep patterns: no sleep issues and sleeps 7 hours nightly. Gets up one time nightly to void.   Home Safety/Smoke Alarms: Feels safe in home. Smoke alarms in place.  Living environment; residence and Firearm Safety: 1-story house/ trailer. No use or need for DME at this time.  Seat Belt Safety/Bike Helmet: Wears seat belt.   Male:   CCS- 08/2016, every 5 years. Due 08/2021.     PSA- last PSA per [facility] report was 8.8ng on 03/17/2018. Followed by Dr. Ivory Broad. Lab Results  Component Value Date   PSA 12.10 (H) 03/06/2017   PSA 14.52 (H) 02/29/2016   PSA 13.07 (H) 02/14/2015       Objective:    Vitals: BP 122/76 (BP Location: Right Arm, Patient Position: Sitting, Cuff Size: Normal)   Pulse 64   Temp 97.8 F (36.6 C)   Resp 14   Ht 5\' 4"  (1.626 m)   Wt 161 lb (73 kg)   SpO2 96%   BMI 27.64 kg/m   Body mass index is 27.64 kg/m.  Advanced Directives 08/21/2018 03/06/2017 09/18/2016 09/04/2016  Does Patient Have a Medical Advance Directive? Yes Yes Yes Yes  Type of Paramedic of [place];Living will - - [place];Living will  Copy of [place] in Chart? No - copy requested - - No - copy requested    Tobacco Social History   Tobacco Use  Smoking Status Former Smoker  . Types: Cigarettes  . Last attempt to quit: 06/25/1968  . Years since quitting: 50.1  Smokeless Tobacco Never Used  Tobacco Comment   not clear but did discuss AAA     Counseling given: Not Answered Comment: not clear but did discuss AAA   Past Medical History:  Diagnosis Date  . Benign prostatic hypertrophy with  elevated PSA    sees Dr. Ivory Broad in [facility]   . GERD (gastroesophageal reflux disease)   . Hyperlipidemia   . Hypertension   . Normal cardiac stress test 09-04-12  . Osteoporosis   . Thyroid disease    hypothyroidism   Past Surgical History:  Procedure Laterality Date  . BASAL CELL CARCINOMA EXCISION  2017   off nose, per Dr. Leilani Merl   . COLONOSCOPY  09/18/2016   per Dr. Henrene Pastor, adenomatous polyp, repeat in 5 yrs  . PROSTATE BIOPSY     x6  . TONSILLECTOMY    . WISDOM TOOTH EXTRACTION     Family History  Problem Relation Age of Onset  . Heart disease Mother   . Heart disease Father   . Colon cancer Brother        late 17's  . Coronary artery disease Other        family hx  . Aneurysm Other        family hx  . Cancer Other        colon  . Colon cancer Other        family hx  . Esophageal cancer Neg Hx   . Stomach cancer Neg Hx    Social History   Socioeconomic History  . Marital status: Married    Spouse name: Not on file  . Number of  children: 2  . Years of education: Not on file  . Highest education level: Not on file  Occupational History  . Occupation: Engineering/business    Comment: Therapist, art, retired  Scientific laboratory technician  . Financial resource strain: Not hard at all  . Food insecurity:    Worry: Never true    Inability: Never true  . Transportation needs:    Medical: No    Non-medical: No  Tobacco Use  . Smoking status: Former Smoker    Types: Cigarettes    Last attempt to quit: 06/25/1968    Years since quitting: 50.1  . Smokeless tobacco: Never Used  . Tobacco comment: not clear but did discuss AAA  Substance and Sexual Activity  . Alcohol use: No    Alcohol/week: 0.0 standard drinks  . Drug use: No  . Sexual activity: Not on file  Lifestyle  . Physical activity:    Days per week: 3 days    Minutes per session: 30 min  . Stress: Not on file  Relationships  . Social connections:    Talks on phone: Three times a week    Gets together: Twice a week      Attends religious service: More than 4 times per year    Active member of club or organization: Yes    Attends meetings of clubs or organizations: More than 4 times per year    Relationship status: Married  Other Topics Concern  . Not on file  Social History Narrative   Lives with wife on one level   Has daughter in [place], one daughter in [place] with four grandchildren   Enjoys travelling with wife; goes to house in [facility] frequently   Attends bible study    Exercises at gym and/or outside routinely, doing yoga, biking    Outpatient Encounter Medications as of 08/21/2018  Medication Sig  . hydrochlorothiazide (HYDRODIURIL) 25 MG tablet Take 1 tablet (25 mg total) by mouth daily.  Marland Kitchen levothyroxine (SYNTHROID, LEVOTHROID) 75 MCG tablet Take 1 tablet (75 mcg total) by mouth daily.  Marland Kitchen losartan (COZAAR) 100 MG tablet Take 1 tablet (100 mg total) by mouth daily.  . potassium chloride (KLOR-CON 10) 10 MEQ tablet Take 1 tablet (10 mEq total) by mouth daily.  . rosuvastatin (CRESTOR) 10 MG tablet Take 1 tablet (10 mg total) by mouth daily.  . [DISCONTINUED] losartan-hydrochlorothiazide (HYZAAR) 100-25 MG tablet Take 1 tablet by mouth daily.   Facility-Administered Encounter Medications as of 08/21/2018  Medication  . 0.9 %  sodium chloride infusion    Activities of Daily Living In your present state of health, do you have any difficulty performing the following activities: 08/21/2018  Hearing? N  Vision? N  Difficulty concentrating or making decisions? N  Walking or climbing stairs? N  Dressing or bathing? N  Doing errands, shopping? N  Preparing Food and eating ? N  Using the Toilet? N  In the past six months, have you accidently leaked urine? N  Do you have problems with loss of bowel control? N  Managing your Medications? N  Managing your Finances? N  Housekeeping or managing your Housekeeping? N  Some recent data might be hidden    Patient Care Team: Laurey Morale, MD as PCP  - Gwenevere Abbot Docia Chuck, MD as Consulting Physician (Gastroenterology) Dr. Ivory Broad (Proctology)   Assessment:   This is a routine wellness examination for [place]. Physical assessment deferred to PCP.   Exercise Activities and Dietary recommendations Current Exercise Habits: Home  exercise routine;Structured exercise class, Type of exercise: walking;yoga;exercise ball(does exercises given to him by chiropracter as well; biking), Time (Minutes): 60, Frequency (Times/Week): 3, Weekly Exercise (Minutes/Week): 180, Intensity: Moderate, Exercise limited by: orthopedic condition(s);cardiac condition(s)(arthritis) Diet (meal preparation, eat out, water intake, caffeinated beverages, dairy products, fruits and vegetables): in general, a "healthy" diet  , stays hydrated. Pt. states over the past 3-6 months he has noticed some difficulty catching breath in upper airway, and wonders if it is GERD-related. GERD diet information reviewed. PCP made aware of pt's concerns.      Goals    . Patient Stated     Lose about 5 pounds by next year by continuing to drink more water, keeping up exercise routine.    . Weight (lb) < 155 lb (70.3 kg)     Try to diary your food intake for calorie consumption and energy expenditure   Try to do what you did the last time!       Fall Risk Fall Risk  08/21/2018 03/07/2018 03/06/2017  Falls in the past year? 0 No No  Risk for fall due to : Impaired balance/gait - -  Follow up Falls prevention discussed - -    Depression Screen PHQ 2/9 Scores 08/21/2018 03/07/2018 03/06/2017  PHQ - 2 Score 0 0 0  PHQ- 9 Score 0 - -    Cognitive Function       Ad8 score reviewed for issues:  Issues making decisions: no  Less interest in hobbies / activities: no  Repeats questions, stories (family complaining): no  Trouble using ordinary gadgets (microwave, computer, phone):no  Forgets the month or year: no  Mismanaging finances: no  Remembering appts: no  Daily  problems with thinking and/or memory: no Ad8 score is= 0    Immunization History  Administered Date(s) Administered  . Influenza Split 04/29/2012  . Influenza Whole 04/25/2007, 03/22/2008, 04/18/2009, 04/18/2010  . Influenza, High Dose Seasonal PF 04/01/2013, 03/05/2016, 03/06/2017, 03/07/2018  . Influenza,inj,Quad PF,6+ Mos 02/17/2014, 02/21/2015  . Pneumococcal Conjugate-13 02/17/2014  . Pneumococcal Polysaccharide-23 05/04/2008  . Tdap 08/27/2012  . Zoster 05/04/2008    Qualifies for Shingles Vaccine? Yes, pt. has been trying to get shingrix but pharmacies do not have in stock.   Screening Tests Health Maintenance  Topic Date Due  . COLONOSCOPY  09/18/2021  . TETANUS/TDAP  08/28/2022  . INFLUENZA VACCINE  Completed  . PNA vac Low Risk Adult  Completed        Plan:    Bring a copy of your living will and/or healthcare power of attorney to your next office visit.  Refer to PSA lowering diet suggestions provided, as well as GERD diet considerations.  Follow up with local pharmacy regarding shingrix availability  Follow up with ophthamology as scheduled.  Follow-up with PCP regarding upper airway concern.  Safe travels to [place], and on your cruise this summer, and beyond  I have personally reviewed and noted the following in the patient's chart:   . Medical and social history . Use of alcohol, tobacco or illicit drugs  . Current medications and supplements . Functional ability and status . Nutritional status . Physical activity . Advanced directives . List of other physicians . Vitals . Screenings to include cognitive, depression, and falls . Referrals and appointments  In addition, I have reviewed and discussed with patient certain preventive protocols, quality metrics, and best practice recommendations. A written personalized care plan for preventive services as well as general preventive health recommendations were provided  to patient.     Alphia Moh, RN  08/21/2018  I have read this note and agree with its contents.  Alysia Penna, MD

## 2018-08-21 NOTE — Patient Instructions (Addendum)
Bring a copy of your living will and/or healthcare power of attorney to your next office visit.  Refer to PSA lowering diet suggestions provided, as well as GERD diet considerations.  Follow up with local pharmacy regarding shingrix availability  Follow up with ophthamology as scheduled.  Follow-up with PCP regarding upper airway concern.  Safe travels to [place], and on your cruise this summer, and beyond!  Thank you for taking time to come for your Medicare Wellness Visit. I appreciate your ongoing commitment to your health goals. Please review the following plan we discussed and let me know if I can assist you in the future.   These are the goals we discussed: Goals    . Patient Stated     Lose about 5 pounds by next year by continuing to drink more water, keeping up exercise routine.    . Weight (lb) < 155 lb (70.3 kg)     Try to diary your food intake for calorie consumption and energy expenditure   Try to do what you did the last time!       This is a list of the screening recommended for you and due dates:  Health Maintenance  Topic Date Due  . Colon Cancer Screening  09/18/2021  . Tetanus Vaccine  08/28/2022  . Flu Shot  Completed  . Pneumonia vaccines  Completed    Food Choices for Gastroesophageal Reflux Disease, Adult When you have gastroesophageal reflux disease (GERD), the foods you eat and your eating habits are very important. Choosing the right foods can help ease your discomfort. Think about working with a nutrition specialist (dietitian) to help you make good choices. What are tips for following this plan?  Meals  Choose healthy foods that are low in fat, such as fruits, vegetables, whole grains, low-fat dairy products, and lean meat, fish, and poultry.  Eat small meals often instead of 3 large meals a day. Eat your meals slowly, and in a place where you are relaxed. Avoid bending over or lying down until 2-3 hours after eating.  Avoid eating meals 2-3  hours before bed.  Avoid drinking a lot of liquid with meals.  Cook foods using methods other than frying. Bake, grill, or broil food instead.  Avoid or limit: ? Chocolate. ? Peppermint or spearmint. ? Alcohol. ? Pepper. ? Black and decaffeinated coffee. ? Black and decaffeinated tea. ? Bubbly (carbonated) soft drinks. ? Caffeinated energy drinks and soft drinks.  Limit high-fat foods such as: ? Fatty meat or fried foods. ? Whole milk, cream, butter, or ice cream. ? Nuts and nut butters. ? Pastries, donuts, and sweets made with butter or shortening.  Avoid foods that cause symptoms. These foods may be different for everyone. Common foods that cause symptoms include: ? Tomatoes. ? Oranges, lemons, and limes. ? Peppers. ? Spicy food. ? Onions and garlic. ? Vinegar. Lifestyle  Maintain a healthy weight. Ask your doctor what weight is healthy for you. If you need to lose weight, work with your doctor to do so safely.  Exercise for at least 30 minutes for 5 or more days each week, or as told by your doctor.  Wear loose-fitting clothes.  Do not smoke. If you need help quitting, ask your doctor.  Sleep with the head of your bed higher than your feet. Use a wedge under the mattress or blocks under the bed frame to raise the head of the bed. Summary  When you have gastroesophageal reflux disease (GERD), food and  lifestyle choices are very important in easing your symptoms.  Eat small meals often instead of 3 large meals a day. Eat your meals slowly, and in a place where you are relaxed.  Limit high-fat foods such as fatty meat or fried foods.  Avoid bending over or lying down until 2-3 hours after eating.  Avoid peppermint and spearmint, caffeine, alcohol, and chocolate. This information is not intended to replace advice given to you by your health care provider. Make sure you discuss any questions you have with your health care provider. Document Released: 12/11/2011  Document Revised: 07/17/2016 Document Reviewed: 07/17/2016 Elsevier Interactive Patient Education  2019 [place] Maintenance, Male A healthy lifestyle and preventive care is important for your health and wellness. Ask your health care provider about what schedule of regular examinations is right for you. What should I know about weight and diet? Eat a Healthy Diet  Eat plenty of vegetables, fruits, whole grains, low-fat dairy products, and lean protein.  Do not eat a lot of foods high in solid fats, added sugars, or salt.  Maintain a Healthy Weight Regular exercise can help you achieve or maintain a healthy weight. You should:  Do at least 150 minutes of exercise each week. The exercise should increase your heart rate and make you sweat (moderate-intensity exercise).  Do strength-training exercises at least twice a week. Watch Your Levels of Cholesterol and Blood Lipids  Have your blood tested for lipids and cholesterol every 5 years starting at 76 years of age. If you are at high risk for heart disease, you should start having your blood tested when you are 76 years old. You may need to have your cholesterol levels checked more often if: ? Your lipid or cholesterol levels are high. ? You are older than 76 years of age. ? You are at high risk for heart disease. What should I know about cancer screening? Many types of cancers can be detected early and may often be prevented. Lung Cancer  You should be screened every year for lung cancer if: ? You are a current smoker who has smoked for at least 30 years. ? You are a former smoker who has quit within the past 15 years.  Talk to your health care provider about your screening options, when you should start screening, and how often you should be screened. Colorectal Cancer  Routine colorectal cancer screening usually begins at 76 years of age and should be repeated every 5-10 years until you are 76 years old. You may  need to be screened more often if early forms of precancerous polyps or small growths are found. Your health care provider may recommend screening at an earlier age if you have risk factors for colon cancer.  Your health care provider may recommend using home test kits to check for hidden blood in the stool.  A small camera at the end of a tube can be used to examine your colon (sigmoidoscopy or colonoscopy). This checks for the earliest forms of colorectal cancer. Prostate and Testicular Cancer  Depending on your age and overall health, your health care provider may do certain tests to screen for prostate and testicular cancer.  Talk to your health care provider about any symptoms or concerns you have about testicular or prostate cancer. Skin Cancer  Check your skin from head to toe regularly.  Tell your health care provider about any new moles or changes in moles, especially if: ? There is a change in  a mole's size, shape, or color. ? You have a mole that is larger than a pencil eraser.  Always use sunscreen. Apply sunscreen liberally and repeat throughout the day.  Protect yourself by wearing long sleeves, pants, a wide-brimmed hat, and sunglasses when outside. What should I know about heart disease, diabetes, and high blood pressure?  If you are 40-53 years of age, have your blood pressure checked every 3-5 years. If you are 18 years of age or older, have your blood pressure checked every year. You should have your blood pressure measured twice-once when you are at a hospital or clinic, and once when you are not at a hospital or clinic. Record the average of the two measurements. To check your blood pressure when you are not at a hospital or clinic, you can use: ? An automated blood pressure machine at a pharmacy. ? A home blood pressure monitor.  Talk to your health care provider about your target blood pressure.  If you are between 6-13 years old, ask your health care provider if  you should take aspirin to prevent heart disease.  Have regular diabetes screenings by checking your fasting blood sugar level. ? If you are at a normal weight and have a low risk for diabetes, have this test once every three years after the age of 70. ? If you are overweight and have a high risk for diabetes, consider being tested at a younger age or more often.  A one-time screening for abdominal aortic aneurysm (AAA) by ultrasound is recommended for men aged 42-75 years who are current or former smokers. What should I know about preventing infection? Hepatitis B If you have a higher risk for hepatitis B, you should be screened for this virus. Talk with your health care provider to find out if you are at risk for hepatitis B infection. Hepatitis C Blood testing is recommended for:  Everyone born from [birth year] through 1963.  Anyone with known risk factors for hepatitis C. Sexually Transmitted Diseases (STDs)  You should be screened each year for STDs including gonorrhea and chlamydia if: ? You are sexually active and are younger than 76 years of age. ? You are older than 76 years of age and your health care provider tells you that you are at risk for this type of infection. ? Your sexual activity has changed since you were last screened and you are at an increased risk for chlamydia or gonorrhea. Ask your health care provider if you are at risk.  Talk with your health care provider about whether you are at high risk of being infected with HIV. Your health care provider may recommend a prescription medicine to help prevent HIV infection. What else can I do?  Schedule regular health, dental, and eye exams.  Stay current with your vaccines (immunizations).  Do not use any tobacco products, such as cigarettes, chewing tobacco, and e-cigarettes. If you need help quitting, ask your health care provider.  Limit alcohol intake to no more than 2 drinks per day. One drink equals 12 ounces of beer,  5 ounces of wine, or 1 ounces of hard liquor.  Do not use street drugs.  Do not share needles.  Ask your health care provider for help if you need support or information about quitting drugs.  Tell your health care provider if you often feel depressed.  Tell your health care provider if you have ever been abused or do not feel safe at home. This information is not intended  to replace advice given to you by your health care provider. Make sure you discuss any questions you have with your health care provider. Document Released: 12/08/2007 Document Revised: 02/08/2016 Document Reviewed: 03/15/2015 Elsevier Interactive Patient Education  2019 [employer].

## 2018-08-21 NOTE — Progress Notes (Signed)
   Subjective:    Patient ID: Mason [name], male    DOB: [birth year], 78 y.o.   MRN: [id number]  HPI Here to discuss some throat and upper chest symptoms that he finds it difficult to describe. These started about a year ago but they are a little worse now. Sometimes he feels a tickle in the back of the throat and sometimes he feels the need to cough. Sometimes he feels that food takes longer to go down when he swallows, though it never stops. He feels the need to clear his throat frequently. He tried taking Zyrtec for awhile but this did not help. He tried taking Omeprazole for awhile, but this did not help either. He had a chest CT last September that was normal except for some modules we have been following. He denies any chest pain or SOB.    Review of Systems  Constitutional: Negative.   HENT: Positive for trouble swallowing. Negative for congestion, postnasal drip, sinus pressure, sinus pain, sore throat and voice change.   Eyes: Negative.   Respiratory: Positive for cough. Negative for choking, chest tightness, shortness of breath, wheezing and stridor.   Cardiovascular: Negative.   Neurological: Negative.        Objective:   Physical Exam Constitutional:      General: He is not in acute distress.    Appearance: Normal appearance.  HENT:     Right Ear: Tympanic membrane and ear canal normal.     Left Ear: Tympanic membrane and ear canal normal.     Nose: Nose normal.     Mouth/Throat:     Pharynx: Oropharynx is clear.  Eyes:     Conjunctiva/sclera: Conjunctivae normal.  Neck:     Musculoskeletal: No neck rigidity.  Cardiovascular:     Rate and Rhythm: Normal rate and regular rhythm.     Pulses: Normal pulses.     Heart sounds: Normal heart sounds.  Pulmonary:     Effort: Pulmonary effort is normal. No respiratory distress.     Breath sounds: Normal breath sounds. No stridor. No wheezing, rhonchi or rales.  Neurological:     Mental Status: He is alert.           Assessment & Plan:  He has some vague throat and upper chest symptoms of unclear etiology. We will refer him to GI to consider possible upper endoscopy. Alysia Penna, MD

## 2018-09-03 ENCOUNTER — Encounter: Payer: Self-pay | Admitting: Physician Assistant

## 2018-09-03 ENCOUNTER — Ambulatory Visit (INDEPENDENT_AMBULATORY_CARE_PROVIDER_SITE_OTHER): Payer: Medicare Other | Admitting: Physician Assistant

## 2018-09-03 ENCOUNTER — Other Ambulatory Visit: Payer: Self-pay

## 2018-09-03 VITALS — BP 100/64 | HR 68 | Temp 98.3°F | Ht 64.0 in | Wt 159.6 lb

## 2018-09-03 DIAGNOSIS — R1310 Dysphagia, unspecified: Secondary | ICD-10-CM | POA: Diagnosis not present

## 2018-09-03 NOTE — Progress Notes (Signed)
Chief Complaint: Dysphagia  HPI:    [name] is a 78 year old male with a past medical history as listed below, known to Dr. Henrene Pastor, who was referred to me by Laurey Morale, MD for a complaint of dysphagia.      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  Repeat recommended in 5 years.    08/21/2018 patient seen at [facility] for dysphagia.  That time described that over the past year he felt a "tickle in the back of his throat and sometimes needed to cough".  Also describes that food seems to take longer to go down he swallowed though it never stopped.  He tried taking Zyrtec for a while which did not help.  Try taking omeprazole for but did not help.  CT chest in September 2019 that was normal except for some nodules which they are following.    Today, patient tells me he has had a full cardiac work-up in the recent past which was normal per him.  Over the past couple of months he has been experiencing a sensation in his esophagus which is "hard to describe".  Patient tells me that he will feel it sometimes during the night or day but it is worse after he eats or drinks something, especially coffee.  Tells me that minutes after drinking/eating something he will feel as though it "tightens in my throat" and it makes him want to take a very deep breath to relieve this feeling.  It only lasts for 15-20 seconds. This occurs at some point every day but does not wake him from his sleep and is not related to heartburn or reflux.    Denies fever, chills, weight loss, anorexia, nausea, vomiting, change in bowel habits, abdominal pain or symptoms that awaken him from sleep.  Past Medical History:  Diagnosis Date   Benign prostatic hypertrophy with elevated PSA    sees Dr. Ivory Broad in [facility]    GERD (gastroesophageal reflux  disease)    Hyperlipidemia    Hypertension    Normal cardiac stress test 09-04-12   Osteoporosis    Thyroid disease    hypothyroidism    Past Surgical History:  Procedure Laterality Date   BASAL CELL CARCINOMA EXCISION  2017   off nose, per Dr. Leilani Merl    COLONOSCOPY  09/18/2016   per Dr. Henrene Pastor, adenomatous polyp, repeat in 5 yrs   PROSTATE BIOPSY     x6   TONSILLECTOMY     WISDOM TOOTH EXTRACTION      Current Outpatient Medications  Medication Sig Dispense Refill   hydrochlorothiazide (HYDRODIURIL) 25 MG tablet Take 1 tablet (25 mg total) by mouth daily. 90 tablet 3   levothyroxine (SYNTHROID, LEVOTHROID) 75 MCG tablet Take 1 tablet (75 mcg total) by mouth daily. 90 tablet 3   losartan (COZAAR) 100 MG tablet Take 1 tablet (100 mg total) by mouth daily. 90 tablet 3   potassium chloride (KLOR-CON 10) 10 MEQ tablet Take 1 tablet (10 mEq total) by mouth daily. 90 tablet 3   rosuvastatin (CRESTOR) 10 MG tablet Take 1 tablet (10 mg total) by mouth daily. 90 tablet 3   Current Facility-Administered Medications  Medication Dose Route Frequency Provider Last Rate Last Dose   0.9 %  sodium chloride infusion  500 mL Intravenous Continuous Irene Shipper, MD  Allergies as of 09/03/2018   (No Known Allergies)    Family History  Problem Relation Age of Onset   Heart disease Mother    Heart disease Father    Colon cancer Brother        late 39's   Coronary artery disease Other        family hx   Aneurysm Other        family hx   Cancer Other        colon   Colon cancer Other        family hx   Esophageal cancer Neg Hx    Stomach cancer Neg Hx     Social History   Socioeconomic History   Marital status: Married    Spouse name: Not on file   Number of children: 2   Years of education: Not on file   Highest education level: Not on file  Occupational History   Occupation: Engineering/business    Comment: Therapist, art, retired  Airline pilot strain: Not hard at [employer] insecurity:    Worry: Never true    Inability: Never true   Transportation needs:    Medical: No    Non-medical: No  Tobacco Use   Smoking status: Former Smoker    Types: Cigarettes    Last attempt to quit: 06/25/1968    Years since quitting: 50.2   Smokeless tobacco: Never Used   Tobacco comment: not clear but did discuss AAA  Substance and Sexual Activity   Alcohol use: No    Alcohol/week: 0.0 standard drinks   Drug use: No   Sexual activity: Not on file  Lifestyle   Physical activity:    Days per week: 3 days    Minutes per session: 30 min   Stress: Not on file  Relationships   Social connections:    Talks on phone: Three times a week    Gets together: Twice a week    Attends religious service: More than 4 times per year    Active member of club or organization: Yes    Attends meetings of clubs or organizations: More than 4 times per year    Relationship status: Married   Intimate partner violence:    Fear of current or ex partner: No    Emotionally abused: No    Physically abused: No    Forced sexual activity: No  Other Topics Concern   Not on file  Social History Narrative   Lives with wife on one level   Has daughter in [place], one daughter in [place] with four grandchildren   Enjoys travelling with wife; goes to house in [place] frequently   Attends bible study    Exercises at gym and/or outside routinely, doing yoga, biking    Review of Systems:    Constitutional: No weight loss, fever or chills Cardiovascular: No chest pain, chest pressure or palpitations   Respiratory: No SOB or cough Gastrointestinal: See HPI and otherwise negative   Physical Exam:  Vital signs: BP 100/64    Pulse 68    Temp 98.3 F (36.8 C) (Oral)    Ht 5\' 4"  (1.626 m)    Wt 159 lb 9.6 oz (72.4 kg)    BMI 27.40 kg/m    Constitutional:   Pleasant Caucasian male appears to be in NAD, Well developed, Well nourished, alert and  cooperative Head:  Normocephalic and atraumatic. Eyes:   PEERL, EOMI. No icterus. Conjunctiva pink.  Ears:  Normal auditory acuity. Neck:  Supple Throat: Oral cavity and pharynx without inflammation, swelling or lesion.  Respiratory: Respirations even and unlabored. Lungs clear to auscultation bilaterally.   No wheezes, crackles, or rhonchi.  Cardiovascular: Normal S1, S2. No MRG. Regular rate and rhythm. No peripheral edema, cyanosis or pallor.  Gastrointestinal:  Soft, nondistended, nontender. No rebound or guarding. Normal bowel sounds. No appreciable masses or hepatomegaly. Psychiatric:  Demonstrates good judgement and reason without abnormal affect or behaviors.  No recent labs.  Assessment: 1.  Dysphagia: Not true dysphagia, things are not getting stuck but patient does feel a tightness which makes him feel like he has to take a deep breath over the past couple of months, worse after eating or drinking something, notably worse after coffee; consider esophageal spasm versus esophagitis versus GERD versus other  Plan: 1.  Discussed options with the patient.  For now we will try over-the-counter Prilosec 20 mg daily, 30-60 minutes before eating breakfast in the morning for the next 2 weeks. 2.  If patient notices no change in symptoms with above then would recommend an EGD for further evaluation.  Did discuss risks, benefits, limitations and alternatives and the patient agrees to proceed. 3.  Patient will call our clinic in the next 1 to 2 weeks and let us know if this has helped him at all.  If it has he can be scheduled for a follow-up appointment for further discussion about length of treatment.  If it has not, then recommend he have an EGD which can be directly added on for Dr. Henrene Pastor in the [facility]. 4.  Patient to follow in clinic after recommendations from above.  Ellouise Newer, PA-C [facility] 09/03/2018, 2:01 PM  Cc: Laurey Morale, MD

## 2018-09-03 NOTE — Patient Instructions (Signed)
Take OTC Prilosec 20 mg daily 30-60 minutes before eating or drinking for 2 weeks... If no help call and let us know  Normal BMI (Body Mass Index- based on height and weight) is between 23 and 30. Your BMI today is Body mass index is 27.4 kg/m. Marland Kitchen Please consider follow up  regarding your BMI with your Primary Care Provider.

## 2018-09-08 NOTE — Progress Notes (Signed)
Mason [name], agree with EGD for persistent or worsening symptoms

## 2018-09-13 ENCOUNTER — Encounter: Payer: Self-pay | Admitting: Family Medicine

## 2018-09-15 DIAGNOSIS — R972 Elevated prostate specific antigen [PSA]: Secondary | ICD-10-CM | POA: Diagnosis not present

## 2018-09-15 DIAGNOSIS — R358 Other polyuria: Secondary | ICD-10-CM | POA: Diagnosis not present

## 2018-09-15 NOTE — Telephone Encounter (Signed)
Dr. Fry please advise. Thanks  

## 2018-09-15 NOTE — Telephone Encounter (Signed)
His age is the only risk factor I can think of, and I doubt that would satisfy [place]

## 2018-09-16 ENCOUNTER — Encounter: Payer: Self-pay | Admitting: Family Medicine

## 2018-09-16 NOTE — Telephone Encounter (Signed)
Try a decongestant like Mucinex for a few days

## 2018-09-16 NOTE — Telephone Encounter (Signed)
Dr. Fry please advise. Thanks  

## 2018-10-20 DIAGNOSIS — D225 Melanocytic nevi of trunk: Secondary | ICD-10-CM | POA: Diagnosis not present

## 2018-10-20 DIAGNOSIS — L821 Other seborrheic keratosis: Secondary | ICD-10-CM | POA: Diagnosis not present

## 2018-10-20 DIAGNOSIS — L570 Actinic keratosis: Secondary | ICD-10-CM | POA: Diagnosis not present

## 2018-10-20 DIAGNOSIS — L814 Other melanin hyperpigmentation: Secondary | ICD-10-CM | POA: Diagnosis not present

## 2019-03-13 DIAGNOSIS — H5213 Myopia, bilateral: Secondary | ICD-10-CM | POA: Diagnosis not present

## 2019-03-16 DIAGNOSIS — R972 Elevated prostate specific antigen [PSA]: Secondary | ICD-10-CM | POA: Diagnosis not present

## 2019-03-16 DIAGNOSIS — R358 Other polyuria: Secondary | ICD-10-CM | POA: Diagnosis not present

## 2019-03-23 ENCOUNTER — Encounter: Payer: Self-pay | Admitting: Family Medicine

## 2019-03-23 ENCOUNTER — Ambulatory Visit (INDEPENDENT_AMBULATORY_CARE_PROVIDER_SITE_OTHER): Payer: Medicare Other | Admitting: Family Medicine

## 2019-03-23 ENCOUNTER — Other Ambulatory Visit: Payer: Self-pay

## 2019-03-23 VITALS — BP 130/62 | HR 57 | Temp 98.0°F | Ht 64.0 in | Wt 148.0 lb

## 2019-03-23 DIAGNOSIS — K219 Gastro-esophageal reflux disease without esophagitis: Secondary | ICD-10-CM

## 2019-03-23 DIAGNOSIS — N401 Enlarged prostate with lower urinary tract symptoms: Secondary | ICD-10-CM

## 2019-03-23 DIAGNOSIS — M150 Primary generalized (osteo)arthritis: Secondary | ICD-10-CM | POA: Diagnosis not present

## 2019-03-23 DIAGNOSIS — E039 Hypothyroidism, unspecified: Secondary | ICD-10-CM

## 2019-03-23 DIAGNOSIS — Z23 Encounter for immunization: Secondary | ICD-10-CM | POA: Diagnosis not present

## 2019-03-23 DIAGNOSIS — R972 Elevated prostate specific antigen [PSA]: Secondary | ICD-10-CM

## 2019-03-23 DIAGNOSIS — E782 Mixed hyperlipidemia: Secondary | ICD-10-CM | POA: Diagnosis not present

## 2019-03-23 DIAGNOSIS — N138 Other obstructive and reflux uropathy: Secondary | ICD-10-CM

## 2019-03-23 DIAGNOSIS — M159 Polyosteoarthritis, unspecified: Secondary | ICD-10-CM

## 2019-03-23 DIAGNOSIS — I10 Essential (primary) hypertension: Secondary | ICD-10-CM | POA: Diagnosis not present

## 2019-03-23 LAB — LIPID PANEL
Cholesterol: 138 mg/dL (ref 0–200)
HDL: 58 mg/dL (ref >39.00)
LDL Cholesterol: 62 mg/dL (ref 0–99)
NonHDL: 80.46
Total CHOL/HDL Ratio: 2
Triglycerides: 90 mg/dL (ref 0.0–149.0)
VLDL: 18 mg/dL (ref 0.0–40.0)

## 2019-03-23 LAB — POC URINALSYSI DIPSTICK (AUTOMATED)
Bilirubin, UA: NEGATIVE
Blood, UA: NEGATIVE
Glucose, UA: NEGATIVE
Ketones, UA: NEGATIVE
Leukocytes, UA: NEGATIVE
Nitrite, UA: NEGATIVE
Protein, UA: NEGATIVE
Spec Grav, UA: 1.01 (ref 1.010–1.025)
Urobilinogen, UA: 0.2 E.U./dL
pH, UA: 7 (ref 5.0–8.0)

## 2019-03-23 LAB — CBC WITH DIFFERENTIAL/PLATELET
Basophils Absolute: 0 10*3/uL (ref 0.0–0.1)
Basophils Relative: 0.8 % (ref 0.0–3.0)
Eosinophils Absolute: 0.1 10*3/uL (ref 0.0–0.7)
Eosinophils Relative: 2.3 % (ref 0.0–5.0)
HCT: 42.6 % (ref 39.0–52.0)
Hemoglobin: 14.8 g/dL (ref 13.0–17.0)
Lymphocytes Relative: 35.8 % (ref 12.0–46.0)
Lymphs Abs: 1.8 10*3/uL (ref 0.7–4.0)
MCHC: 34.7 g/dL (ref 30.0–36.0)
MCV: 91.5 fl (ref 78.0–100.0)
Monocytes Absolute: 0.5 10*3/uL (ref 0.1–1.0)
Monocytes Relative: 9.9 % (ref 3.0–12.0)
Neutro Abs: 2.6 10*3/uL (ref 1.4–7.7)
Neutrophils Relative %: 51.2 % (ref 43.0–77.0)
Platelets: 221 10*3/uL (ref 150.0–400.0)
RBC: 4.65 Mil/uL (ref 4.22–5.81)
RDW: 13.2 % (ref 11.5–15.5)
WBC: 5.2 10*3/uL (ref 4.0–10.5)

## 2019-03-23 LAB — HEPATIC FUNCTION PANEL
ALT: 18 U/L (ref 0–53)
AST: 20 U/L (ref 0–37)
Albumin: 4.5 g/dL (ref 3.5–5.2)
Alkaline Phosphatase: 50 U/L (ref 39–117)
Bilirubin, Direct: 0.1 mg/dL (ref 0.0–0.3)
Total Bilirubin: 0.8 mg/dL (ref 0.2–1.2)
Total Protein: 6.7 g/dL (ref 6.0–8.3)

## 2019-03-23 LAB — TSH: TSH: 1.17 u[IU]/mL (ref 0.35–4.50)

## 2019-03-23 LAB — BASIC METABOLIC PANEL
BUN: 11 mg/dL (ref 6–23)
CO2: 29 mEq/L (ref 19–32)
Calcium: 9.9 mg/dL (ref 8.4–10.5)
Chloride: 99 mEq/L (ref 96–112)
Creatinine, Ser: 1.04 mg/dL (ref 0.40–1.50)
GFR: 69.44 mL/min (ref >60.00)
Glucose, Bld: 83 mg/dL (ref 70–99)
Potassium: 3.5 mEq/L (ref 3.5–5.1)
Sodium: 137 mEq/L (ref 135–145)

## 2019-03-23 LAB — T4, FREE: Free T4: 1.11 ng/dL (ref 0.60–1.60)

## 2019-03-23 LAB — T3, FREE: T3, Free: 2.8 pg/mL (ref 2.3–4.2)

## 2019-03-23 MED ORDER — LEVOTHYROXINE SODIUM 75 MCG PO TABS: 75.0000 ug | ORAL_TABLET | Freq: Every day | ORAL | 3 refills | Status: DC

## 2019-03-23 MED ORDER — ROSUVASTATIN CALCIUM 10 MG PO TABS: 10.0000 mg | ORAL_TABLET | Freq: Every day | ORAL | 3 refills | Status: DC

## 2019-03-23 MED ORDER — POTASSIUM CHLORIDE ER 10 MEQ PO TBCR: 10.0000 meq | EXTENDED_RELEASE_TABLET | Freq: Every day | ORAL | 3 refills | Status: DC

## 2019-03-23 NOTE — Progress Notes (Signed)
Subjective:    Patient ID: Mason [name], male    DOB: Apr 13, 1941, 78 y.o.   MRN: [id number]  HPI Here to follow up on issues. He feels great. He is in [place] for 2 weeks, and then he and his wife will go back to [place] where they spend the winters. His BP is stable. He has lost a few pound.s he saw Dr. Orland Mustard last week and the PSA remains elevated but they are following this.    Review of Systems  Constitutional: Negative.   HENT: Negative.   Eyes: Negative.   Respiratory: Negative.   Cardiovascular: Negative.   Gastrointestinal: Negative.   Genitourinary: Negative.   Musculoskeletal: Negative.   Skin: Negative.   Neurological: Negative.   Psychiatric/Behavioral: Negative.        Objective:   Physical Exam Constitutional:      General: He is not in acute distress.    Appearance: He is well-developed. He is not diaphoretic.  HENT:     Head: Normocephalic and atraumatic.     Right Ear: External ear normal.     Left Ear: External ear normal.     Nose: Nose normal.     Mouth/Throat:     Pharynx: No oropharyngeal exudate.  Eyes:     General: No scleral icterus.       Right eye: No discharge.        Left eye: No discharge.     Conjunctiva/sclera: Conjunctivae normal.     Pupils: Pupils are equal, round, and reactive to light.  Neck:     Musculoskeletal: Neck supple.     Thyroid: No thyromegaly.     Vascular: No JVD.     Trachea: No tracheal deviation.  Cardiovascular:     Rate and Rhythm: Normal rate and regular rhythm.     Heart sounds: Normal heart sounds. No murmur. No friction rub. No gallop.   Pulmonary:     Effort: Pulmonary effort is normal. No respiratory distress.     Breath sounds: Normal breath sounds. No wheezing or rales.  Chest:     Chest wall: No tenderness.  Abdominal:     General: Bowel sounds are normal. There is no distension.     Palpations: Abdomen is soft. There is no mass.     Tenderness: There is no abdominal tenderness. There  is no guarding or rebound.  Genitourinary:    Penis: No tenderness.   Musculoskeletal: Normal range of motion.        General: No tenderness.  Lymphadenopathy:     Cervical: No cervical adenopathy.  Skin:    General: Skin is warm and dry.     Coloration: Skin is not pale.     Findings: No erythema or rash.  Neurological:     Mental Status: He is alert and oriented to person, place, and time.     Cranial Nerves: No cranial nerve deficit.     Motor: No abnormal muscle tone.     Coordination: Coordination normal.     Deep Tendon Reflexes: Reflexes are normal and symmetric. Reflexes normal.  Psychiatric:        Behavior: Behavior normal.        Thought Content: Thought content normal.        Judgment: Judgment normal.           Assessment & Plan:  His HTN is stable. He will follow up with urology for the elevated PSA> get fasting labs today to check a  thyroid panel and lipids, etc.  Alysia Penna, MD

## 2019-03-27 ENCOUNTER — Encounter: Payer: Self-pay | Admitting: Family Medicine

## 2019-04-22 ENCOUNTER — Other Ambulatory Visit: Payer: Self-pay | Admitting: Family Medicine

## 2019-04-22 DIAGNOSIS — Z85828 Personal history of other malignant neoplasm of skin: Secondary | ICD-10-CM | POA: Diagnosis not present

## 2019-04-22 DIAGNOSIS — L814 Other melanin hyperpigmentation: Secondary | ICD-10-CM | POA: Diagnosis not present

## 2019-04-22 DIAGNOSIS — D225 Melanocytic nevi of trunk: Secondary | ICD-10-CM | POA: Diagnosis not present

## 2019-04-22 DIAGNOSIS — L218 Other seborrheic dermatitis: Secondary | ICD-10-CM | POA: Diagnosis not present

## 2019-04-22 DIAGNOSIS — L821 Other seborrheic keratosis: Secondary | ICD-10-CM | POA: Diagnosis not present

## 2019-04-22 DIAGNOSIS — Z08 Encounter for follow-up examination after completed treatment for malignant neoplasm: Secondary | ICD-10-CM | POA: Diagnosis not present

## 2019-04-22 DIAGNOSIS — L570 Actinic keratosis: Secondary | ICD-10-CM | POA: Diagnosis not present

## 2019-05-07 DIAGNOSIS — M7061 Trochanteric bursitis, right hip: Secondary | ICD-10-CM | POA: Diagnosis not present

## 2019-05-25 ENCOUNTER — Telehealth: Payer: Self-pay | Admitting: Family Medicine

## 2019-05-25 MED ORDER — LOSARTAN POTASSIUM 100 MG PO TABS: 100.0000 mg | ORAL_TABLET | Freq: Every day | ORAL | 3 refills | Status: DC

## 2019-05-25 NOTE — Telephone Encounter (Signed)
Rx refilled.

## 2019-05-25 NOTE — Telephone Encounter (Signed)
Medication Refill - Medication: losartan (COZAAR) 100 MG tablet  hydrochlorothiazide (HYDRODIURIL) 25 MG tablet   Has the patient contacted their pharmacy? Yes.   (Agent: If no, request that the patient contact the pharmacy for the refill.) (Agent: If yes, when and what did the pharmacy advise?)  Preferred Pharmacy (with phone number or street name):  [facility] [address] [place] - [place] [phone] (Phone) [phone] (Fax)     Agent: Please be advised that RX refills may take up to 3 business days. We ask that you follow-up with your pharmacy.

## 2019-05-26 MED ORDER — HYDROCHLOROTHIAZIDE 25 MG PO TABS: 25.0000 mg | ORAL_TABLET | Freq: Every day | ORAL | 3 refills | Status: DC

## 2019-05-26 NOTE — Telephone Encounter (Signed)
Rx refilled.

## 2019-05-26 NOTE — Telephone Encounter (Signed)
Patient is calling to ask that the doctor send his other BP medication, hydrochlorothiazide (HYDRODIURIL) 25 MG tablet,  to the pharmacy as well.  He stated he only got one medication, Losartan.

## 2019-05-26 NOTE — Addendum Note (Signed)
Addended by: Gwenyth Ober R on: 05/26/2019 03:54 PM   Modules accepted: Orders

## 2019-09-14 DIAGNOSIS — R358 Other polyuria: Secondary | ICD-10-CM | POA: Diagnosis not present

## 2019-09-14 DIAGNOSIS — R972 Elevated prostate specific antigen [PSA]: Secondary | ICD-10-CM | POA: Diagnosis not present

## 2019-10-14 DIAGNOSIS — D225 Melanocytic nevi of trunk: Secondary | ICD-10-CM | POA: Diagnosis not present

## 2019-10-14 DIAGNOSIS — L538 Other specified erythematous conditions: Secondary | ICD-10-CM | POA: Diagnosis not present

## 2019-10-14 DIAGNOSIS — L570 Actinic keratosis: Secondary | ICD-10-CM | POA: Diagnosis not present

## 2019-10-14 DIAGNOSIS — L821 Other seborrheic keratosis: Secondary | ICD-10-CM | POA: Diagnosis not present

## 2019-10-14 DIAGNOSIS — Z789 Other specified health status: Secondary | ICD-10-CM | POA: Diagnosis not present

## 2019-10-14 DIAGNOSIS — Z08 Encounter for follow-up examination after completed treatment for malignant neoplasm: Secondary | ICD-10-CM | POA: Diagnosis not present

## 2019-10-14 DIAGNOSIS — L814 Other melanin hyperpigmentation: Secondary | ICD-10-CM | POA: Diagnosis not present

## 2019-10-14 DIAGNOSIS — Z7189 Other specified counseling: Secondary | ICD-10-CM | POA: Diagnosis not present

## 2019-10-14 DIAGNOSIS — L820 Inflamed seborrheic keratosis: Secondary | ICD-10-CM | POA: Diagnosis not present

## 2019-10-14 DIAGNOSIS — L298 Other pruritus: Secondary | ICD-10-CM | POA: Diagnosis not present

## 2019-10-14 DIAGNOSIS — Z85828 Personal history of other malignant neoplasm of skin: Secondary | ICD-10-CM | POA: Diagnosis not present

## 2019-10-14 DIAGNOSIS — L708 Other acne: Secondary | ICD-10-CM | POA: Diagnosis not present

## 2019-11-10 DIAGNOSIS — C44629 Squamous cell carcinoma of skin of left upper limb, including shoulder: Secondary | ICD-10-CM | POA: Diagnosis not present

## 2019-11-10 DIAGNOSIS — D485 Neoplasm of uncertain behavior of skin: Secondary | ICD-10-CM | POA: Diagnosis not present

## 2019-11-23 DIAGNOSIS — R309 Painful micturition, unspecified: Secondary | ICD-10-CM | POA: Diagnosis not present

## 2020-01-12 DIAGNOSIS — Z48817 Encounter for surgical aftercare following surgery on the skin and subcutaneous tissue: Secondary | ICD-10-CM | POA: Diagnosis not present

## 2020-01-19 DIAGNOSIS — Z48817 Encounter for surgical aftercare following surgery on the skin and subcutaneous tissue: Secondary | ICD-10-CM | POA: Diagnosis not present

## 2020-03-14 ENCOUNTER — Encounter: Payer: Self-pay | Admitting: Family Medicine

## 2020-03-16 DIAGNOSIS — D3131 Benign neoplasm of right choroid: Secondary | ICD-10-CM | POA: Diagnosis not present

## 2020-03-16 MED ORDER — LEVOTHYROXINE SODIUM 75 MCG PO TABS: 75.0000 ug | ORAL_TABLET | Freq: Every day | ORAL | 3 refills | Status: DC

## 2020-03-16 MED ORDER — HYDROCHLOROTHIAZIDE 25 MG PO TABS: 25.0000 mg | ORAL_TABLET | Freq: Every day | ORAL | 3 refills | Status: DC

## 2020-03-16 MED ORDER — LOSARTAN POTASSIUM 100 MG PO TABS: 100.0000 mg | ORAL_TABLET | Freq: Every day | ORAL | 3 refills | Status: DC

## 2020-03-16 MED ORDER — POTASSIUM CHLORIDE ER 10 MEQ PO TBCR: 10.0000 meq | EXTENDED_RELEASE_TABLET | Freq: Every day | ORAL | 3 refills | Status: DC

## 2020-03-21 DIAGNOSIS — R358 Other polyuria: Secondary | ICD-10-CM | POA: Diagnosis not present

## 2020-03-21 DIAGNOSIS — R972 Elevated prostate specific antigen [PSA]: Secondary | ICD-10-CM | POA: Diagnosis not present

## 2020-03-24 ENCOUNTER — Other Ambulatory Visit: Payer: Self-pay

## 2020-03-25 ENCOUNTER — Encounter: Payer: Self-pay | Admitting: Family Medicine

## 2020-03-25 ENCOUNTER — Ambulatory Visit (INDEPENDENT_AMBULATORY_CARE_PROVIDER_SITE_OTHER): Payer: Medicare Other | Admitting: Family Medicine

## 2020-03-25 VITALS — BP 124/78 | HR 57 | Temp 98.0°F | Ht 64.0 in | Wt 158.0 lb

## 2020-03-25 DIAGNOSIS — K219 Gastro-esophageal reflux disease without esophagitis: Secondary | ICD-10-CM | POA: Diagnosis not present

## 2020-03-25 DIAGNOSIS — R918 Other nonspecific abnormal finding of lung field: Secondary | ICD-10-CM | POA: Diagnosis not present

## 2020-03-25 DIAGNOSIS — N401 Enlarged prostate with lower urinary tract symptoms: Secondary | ICD-10-CM

## 2020-03-25 DIAGNOSIS — I10 Essential (primary) hypertension: Secondary | ICD-10-CM | POA: Diagnosis not present

## 2020-03-25 DIAGNOSIS — E782 Mixed hyperlipidemia: Secondary | ICD-10-CM

## 2020-03-25 DIAGNOSIS — Z122 Encounter for screening for malignant neoplasm of respiratory organs: Secondary | ICD-10-CM

## 2020-03-25 DIAGNOSIS — Z23 Encounter for immunization: Secondary | ICD-10-CM

## 2020-03-25 DIAGNOSIS — R972 Elevated prostate specific antigen [PSA]: Secondary | ICD-10-CM | POA: Diagnosis not present

## 2020-03-25 DIAGNOSIS — M8949 Other hypertrophic osteoarthropathy, multiple sites: Secondary | ICD-10-CM

## 2020-03-25 DIAGNOSIS — N138 Other obstructive and reflux uropathy: Secondary | ICD-10-CM | POA: Diagnosis not present

## 2020-03-25 DIAGNOSIS — E039 Hypothyroidism, unspecified: Secondary | ICD-10-CM | POA: Diagnosis not present

## 2020-03-25 DIAGNOSIS — M159 Polyosteoarthritis, unspecified: Secondary | ICD-10-CM

## 2020-03-25 MED ORDER — ROSUVASTATIN CALCIUM 10 MG PO TABS: 10.0000 mg | ORAL_TABLET | Freq: Every day | ORAL | 3 refills | Status: DC

## 2020-03-25 NOTE — Progress Notes (Signed)
Subjective:    Patient ID: Mason [name], male    DOB: 09-24-1940, 79 y.o.   MRN: [id number]  HPI Here to follow up on issues. He is doing well. He saw Dr. Harlow Asa recently for another PSA, and this is still pending. He is active, riding his bike and playing golf. He will leave for [place] in 2 weeks to spend the winter there. His BP is stable.    Review of Systems  Constitutional: Negative.   HENT: Negative.   Eyes: Negative.   Respiratory: Negative.   Cardiovascular: Negative.   Gastrointestinal: Negative.   Genitourinary: Negative.   Musculoskeletal: Negative.   Skin: Negative.   Neurological: Negative.   Psychiatric/Behavioral: Negative.        Objective:   Physical Exam Constitutional:      General: He is not in acute distress.    Appearance: He is well-developed. He is not diaphoretic.  HENT:     Head: Normocephalic and atraumatic.     Right Ear: External ear normal.     Left Ear: External ear normal.     Nose: Nose normal.     Mouth/Throat:     Pharynx: No oropharyngeal exudate.  Eyes:     General: No scleral icterus.       Right eye: No discharge.        Left eye: No discharge.     Conjunctiva/sclera: Conjunctivae normal.     Pupils: Pupils are equal, round, and reactive to light.  Neck:     Thyroid: No thyromegaly.     Vascular: No JVD.     Trachea: No tracheal deviation.  Cardiovascular:     Rate and Rhythm: Normal rate and regular rhythm.     Heart sounds: Normal heart sounds. No murmur heard.  No friction rub. No gallop.   Pulmonary:     Effort: Pulmonary effort is normal. No respiratory distress.     Breath sounds: Normal breath sounds. No wheezing or rales.  Chest:     Chest wall: No tenderness.  Abdominal:     General: Bowel sounds are normal. There is no distension.     Palpations: Abdomen is soft. There is no mass.     Tenderness: There is no abdominal tenderness. There is no guarding or rebound.  Genitourinary:    Penis: No  tenderness.   Musculoskeletal:        General: No tenderness. Normal range of motion.     Cervical back: Neck supple.  Lymphadenopathy:     Cervical: No cervical adenopathy.  Skin:    General: Skin is warm and dry.     Coloration: Skin is not pale.     Findings: No erythema or rash.  Neurological:     Mental Status: He is alert and oriented to person, place, and time.     Cranial Nerves: No cranial nerve deficit.     Motor: No abnormal muscle tone.     Coordination: Coordination normal.     Deep Tendon Reflexes: Reflexes are normal and symmetric. Reflexes normal.  Psychiatric:        Behavior: Behavior normal.        Thought Content: Thought content normal.        Judgment: Judgment normal.           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 We will get fasting labs today to check lipids, etc. He is due for a 2 year follow up  study on lung nodules, so we will set up a chest CT soon.  Alysia Penna, MD

## 2020-03-26 LAB — CBC WITH DIFFERENTIAL/PLATELET
Absolute Monocytes: 480 cells/uL (ref 200–950)
Basophils Absolute: 48 cells/uL (ref 0–200)
Basophils Relative: 0.8 %
Eosinophils Absolute: 162 cells/uL (ref 15–500)
Eosinophils Relative: 2.7 %
HCT: 44.6 % (ref 38.5–50.0)
Hemoglobin: 15.3 g/dL (ref 13.2–17.1)
Lymphs Abs: 1740 cells/uL (ref 850–3900)
MCH: 32.3 pg (ref 27.0–33.0)
MCHC: 34.3 g/dL (ref 32.0–36.0)
MCV: 94.3 fL (ref 80.0–100.0)
MPV: 10.1 fL (ref 7.5–12.5)
Monocytes Relative: 8 %
Neutro Abs: 3570 cells/uL (ref 1500–7800)
Neutrophils Relative %: 59.5 %
Platelets: 223 10*3/uL (ref 140–400)
RBC: 4.73 10*6/uL (ref 4.20–5.80)
RDW: 12.6 % (ref 11.0–15.0)
Total Lymphocyte: 29 %
WBC: 6 10*3/uL (ref 3.8–10.8)

## 2020-03-26 LAB — HEPATIC FUNCTION PANEL
AG Ratio: 2.1 (calc) (ref 1.0–2.5)
ALT: 15 U/L (ref 9–46)
AST: 19 U/L (ref 10–35)
Albumin: 4.4 g/dL (ref 3.6–5.1)
Alkaline phosphatase (APISO): 50 U/L (ref 35–144)
Bilirubin, Direct: 0.1 mg/dL (ref 0.0–0.2)
Globulin: 2.1 g/dL (calc) (ref 1.9–3.7)
Indirect Bilirubin: 0.6 mg/dL (calc) (ref 0.2–1.2)
Total Bilirubin: 0.7 mg/dL (ref 0.2–1.2)
Total Protein: 6.5 g/dL (ref 6.1–8.1)

## 2020-03-26 LAB — BASIC METABOLIC PANEL
BUN: 16 mg/dL (ref 7–25)
CO2: 29 mmol/L (ref 20–32)
Calcium: 9.7 mg/dL (ref 8.6–10.3)
Chloride: 102 mmol/L (ref 98–110)
Creat: 1.09 mg/dL (ref 0.70–1.18)
Glucose, Bld: 96 mg/dL (ref 65–99)
Potassium: 3.6 mmol/L (ref 3.5–5.3)
Sodium: 139 mmol/L (ref 135–146)

## 2020-03-26 LAB — LIPID PANEL
Cholesterol: 150 mg/dL (ref <200)
HDL: 57 mg/dL (ref >=40)
LDL Cholesterol (Calc): 76 mg/dL (calc)
Non-HDL Cholesterol (Calc): 93 mg/dL (calc) (ref <130)
Total CHOL/HDL Ratio: 2.6 (calc) (ref <5.0)
Triglycerides: 85 mg/dL (ref <150)

## 2020-03-26 LAB — TSH: TSH: 3.27 mIU/L (ref 0.40–4.50)

## 2020-03-31 ENCOUNTER — Ambulatory Visit (INDEPENDENT_AMBULATORY_CARE_PROVIDER_SITE_OTHER): Payer: Medicare Other

## 2020-03-31 ENCOUNTER — Other Ambulatory Visit: Payer: Self-pay

## 2020-03-31 DIAGNOSIS — Z0000 Encounter for general adult medical examination without abnormal findings: Secondary | ICD-10-CM

## 2020-03-31 NOTE — Patient Instructions (Signed)
Mason [name] , Thank you for taking time to come for your Medicare Wellness Visit. I appreciate your ongoing commitment to your health goals. Please review the following plan we discussed and let me know if I can assist you in the future.   Screening recommendations/referrals: Colonoscopy: Up to date next due 09/18/2021 Recommended yearly ophthalmology/optometry visit for glaucoma screening and checkup Recommended yearly dental visit for hygiene and checkup  Vaccinations: Influenza vaccine: Up to date, next due fall 2022 Pneumococcal vaccine: Completed series Tdap vaccine: Up to date, next due 08/28/2022 Shingles vaccine: Currently due for shingrix, you may receive this at your local pharmacy     Advanced directives: Please bring a copy of your advanced medical directives into the office so that we may scan into your chart.  Conditions/risks identified: None  Next appointment: None   Preventive Care 65 Years and Older, Male Preventive care refers to lifestyle choices and visits with your health care provider that can promote health and wellness. What does preventive care include?  A yearly physical exam. This is also called an annual well check.  Dental exams once or twice a year.  Routine eye exams. Ask your health care provider how often you should have your eyes checked.  Personal lifestyle choices, including:  Daily care of your teeth and gums.  Regular physical activity.  Eating a healthy diet.  Avoiding tobacco and drug use.  Limiting alcohol use.  Practicing safe sex.  Taking low doses of aspirin every day.  Taking vitamin and mineral supplements as recommended by your health care provider. What happens during an annual well check? The services and screenings done by your health care provider during your annual well check will depend on your age, overall health, lifestyle risk factors, and family history of disease. Counseling  Your health care provider may ask  you questions about your:  Alcohol use.  Tobacco use.  Drug use.  Emotional well-being.  Home and relationship well-being.  Sexual activity.  Eating habits.  History of falls.  Memory and ability to understand (cognition).  Work and work Statistician. Screening  You may have the following tests or measurements:  Height, weight, and BMI.  Blood pressure.  Lipid and cholesterol levels. These may be checked every 5 years, or more frequently if you are over 46 years old.  Skin check.  Lung cancer screening. You may have this screening every year starting at age 4 if you have a 30-pack-year history of smoking and currently smoke or have quit within the past 15 years.  Fecal occult blood test (FOBT) of the stool. You may have this test every year starting at age 48.  Flexible sigmoidoscopy or colonoscopy. You may have a sigmoidoscopy every 5 years or a colonoscopy every 10 years starting at age 37.  Prostate cancer screening. Recommendations will vary depending on your family history and other risks.  Hepatitis C blood test.  Hepatitis B blood test.  Sexually transmitted disease (STD) testing.  Diabetes screening. This is done by checking your blood sugar (glucose) after you have not eaten for a while (fasting). You may have this done every 1-3 years.  Abdominal aortic aneurysm (AAA) screening. You may need this if you are a current or former smoker.  Osteoporosis. You may be screened starting at age 24 if you are at high risk. Talk with your health care provider about your test results, treatment options, and if necessary, the need for more tests. Vaccines  Your health care provider may  recommend certain vaccines, such as:  Influenza vaccine. This is recommended every year.  Tetanus, diphtheria, and acellular pertussis (Tdap, Td) vaccine. You may need a Td booster every 10 years.  Zoster vaccine. You may need this after age 26.  Pneumococcal 13-valent conjugate  (PCV13) vaccine. One dose is recommended after age 59.  Pneumococcal polysaccharide (PPSV23) vaccine. One dose is recommended after age 61. Talk to your health care provider about which screenings and vaccines you need and how often you need them. This information is not intended to replace advice given to you by your health care provider. Make sure you discuss any questions you have with your health care provider. Document Released: 07/08/2015 Document Revised: 02/29/2016 Document Reviewed: 04/12/2015 Elsevier Interactive Patient Education  2017 [place] Prevention in the Home Falls can cause injuries. They can happen to people of all ages. There are many things you can do to make your home safe and to help prevent falls. What can I do on the outside of my home?  Regularly fix the edges of walkways and driveways and fix any cracks.  Remove anything that might make you trip as you walk through a door, such as a raised step or threshold.  Trim any bushes or trees on the path to your home.  Use bright outdoor lighting.  Clear any walking paths of anything that might make someone trip, such as rocks or tools.  Regularly check to see if handrails are loose or broken. Make sure that both sides of any steps have handrails.  Any raised decks and porches should have guardrails on the edges.  Have any leaves, snow, or ice cleared regularly.  Use sand or salt on walking paths during winter.  Clean up any spills in your garage right away. This includes oil or grease spills. What can I do in the bathroom?  Use night lights.  Install grab bars by the toilet and in the tub and shower. Do not use towel bars as grab bars.  Use non-skid mats or decals in the tub or shower.  If you need to sit down in the shower, use a plastic, non-slip stool.  Keep the floor dry. Clean up any water that spills on the floor as soon as it happens.  Remove soap buildup in the tub or shower  regularly.  Attach bath mats securely with double-sided non-slip rug tape.  Do not have throw rugs and other things on the floor that can make you trip. What can I do in the bedroom?  Use night lights.  Make sure that you have a light by your bed that is easy to reach.  Do not use any sheets or blankets that are too big for your bed. They should not hang down onto the floor.  Have a firm chair that has side arms. You can use this for support while you get dressed.  Do not have throw rugs and other things on the floor that can make you trip. What can I do in the kitchen?  Clean up any spills right away.  Avoid walking on wet floors.  Keep items that you use a lot in easy-to-reach places.  If you need to reach something above you, use a strong step stool that has a grab bar.  Keep electrical cords out of the way.  Do not use floor polish or wax that makes floors slippery. If you must use wax, use non-skid floor wax.  Do not have throw rugs and  other things on the floor that can make you trip. What can I do with my stairs?  Do not leave any items on the stairs.  Make sure that there are handrails on both sides of the stairs and use them. Fix handrails that are broken or loose. Make sure that handrails are as long as the stairways.  Check any carpeting to make sure that it is firmly attached to the stairs. Fix any carpet that is loose or worn.  Avoid having throw rugs at the top or bottom of the stairs. If you do have throw rugs, attach them to the floor with carpet tape.  Make sure that you have a light switch at the top of the stairs and the bottom of the stairs. If you do not have them, ask someone to add them for you. What else can I do to help prevent falls?  Wear shoes that:  Do not have high heels.  Have rubber bottoms.  Are comfortable and fit you well.  Are closed at the toe. Do not wear sandals.  If you use a stepladder:  Make sure that it is fully  opened. Do not climb a closed stepladder.  Make sure that both sides of the stepladder are locked into place.  Ask someone to hold it for you, if possible.  Clearly mark and make sure that you can see:  Any grab bars or handrails.  First and last steps.  Where the edge of each step is.  Use tools that help you move around (mobility aids) if they are needed. These include:  Canes.  Walkers.  Scooters.  Crutches.  Turn on the lights when you go into a dark area. Replace any light bulbs as soon as they burn out.  Set up your furniture so you have a clear path. Avoid moving your furniture around.  If any of your floors are uneven, fix them.  If there are any pets around you, be aware of where they are.  Review your medicines with your doctor. Some medicines can make you feel dizzy. This can increase your chance of falling. Ask your doctor what other things that you can do to help prevent falls. This information is not intended to replace advice given to you by your health care provider. Make sure you discuss any questions you have with your health care provider. Document Released: 04/07/2009 Document Revised: 11/17/2015 Document Reviewed: 07/16/2014 Elsevier Interactive Patient Education  2017 [employer].

## 2020-03-31 NOTE — Progress Notes (Signed)
Subjective:   Mason [name] is a 79 y.o. male who presents for an Initial Medicare Annual Wellness Visit.  I connected with Mason [name] today by telephone and verified that I am speaking with the correct person using two identifiers. Location patient: home Location provider: work Persons participating in the virtual visit: patient, provider.   I discussed the limitations, risks, security and privacy concerns of performing an evaluation and management service by telephone and the availability of in person appointments. I also discussed with the patient that there may be a patient responsible charge related to this service. The patient expressed understanding and verbally consented to this telephonic visit.    Interactive audio and video telecommunications were attempted between this provider and patient, however failed, due to patient having technical difficulties OR patient did not have access to video capability.  We continued and completed visit with audio only.      Review of Systems    N/A  Cardiac Risk Factors include: advanced age (>48men, >3 women);hypertension;male gender     Objective:    Today's Vitals   There is no height or weight on file to calculate BMI.  Advanced Directives 03/31/2020 08/21/2018 03/06/2017 09/18/2016 09/04/2016  Does Patient Have a Medical Advance Directive? Yes Yes Yes Yes Yes  Type of Paramedic of [place];Living will [place];Living will - - [place];Living will  Does patient want to make changes to medical advance directive? No - Patient declined - - - -  Copy of [place] in Chart? - No - copy requested - - No - copy requested    Current Medications (verified) Outpatient Encounter Medications as of 03/31/2020  Medication Sig  . hydrochlorothiazide (HYDRODIURIL) 25 MG tablet Take 1 tablet (25 mg total) by mouth daily.  Marland Kitchen levothyroxine (SYNTHROID) 75 MCG  tablet Take 1 tablet (75 mcg total) by mouth daily.  Marland Kitchen losartan (COZAAR) 100 MG tablet Take 1 tablet (100 mg total) by mouth daily.  . potassium chloride (KLOR-CON 10) 10 MEQ tablet Take 1 tablet (10 mEq total) by mouth daily.  . rosuvastatin (CRESTOR) 10 MG tablet Take 1 tablet (10 mg total) by mouth daily.   Facility-Administered Encounter Medications as of 03/31/2020  Medication  . 0.9 %  sodium chloride infusion    Allergies (verified) Patient has no known allergies.   History: Past Medical History:  Diagnosis Date  . Benign prostatic hypertrophy with elevated PSA    sees Dr. Ivory Broad in [facility]   . GERD (gastroesophageal reflux disease)   . Hyperlipidemia   . Hypertension   . Normal cardiac stress test 09-04-12  . Osteoporosis   . Thyroid disease    hypothyroidism   Past Surgical History:  Procedure Laterality Date  . BASAL CELL CARCINOMA EXCISION  2017   off nose, per Dr. Leilani Merl   . COLONOSCOPY  09/18/2016   per Dr. Henrene Pastor, adenomatous polyp, repeat in 5 yrs  . PROSTATE BIOPSY     x6  . TONSILLECTOMY    . WISDOM TOOTH EXTRACTION     Family History  Problem Relation Age of Onset  . Heart disease Mother   . Heart disease Father   . Colon cancer Brother        late 23's  . Coronary artery disease Other        family hx  . Aneurysm Other        family hx  . Cancer Other  colon  . Colon cancer Other        family hx  . Esophageal cancer Neg Hx   . Stomach cancer Neg Hx    Social History   Socioeconomic History  . Marital status: Married    Spouse name: Not on file  . Number of children: 2  . Years of education: Not on file  . Highest education level: Not on file  Occupational History  . Occupation: Engineering/business    CommentAcupuncturist, retired  Tobacco Use  . Smoking status: Former Smoker    Types: Cigarettes    Quit date: 06/25/1968    Years since quitting: 51.8  . Smokeless tobacco: Never Used  . Tobacco comment: not clear but did discuss  AAA  Vaping Use  . Vaping Use: Never used  Substance and Sexual Activity  . Alcohol use: No    Alcohol/week: 0.0 standard drinks  . Drug use: No  . Sexual activity: Not on file  Other Topics Concern  . Not on file  Social History Narrative   Lives with wife on one level   Has daughter in [place], one daughter in [place] with four grandchildren   Enjoys travelling with wife; goes to house in [facility] frequently   Attends bible study    Exercises at gym and/or outside routinely, doing yoga, biking   Social Determinants of Health   Financial Resource Strain: [place]   . Difficulty of Paying Living Expenses: Not hard at all  Food Insecurity: No Food Insecurity  . Worried About Charity fundraiser in the Last Year: Never true  . Ran Out of Food in the Last Year: Never true  Transportation Needs: No Transportation Needs  . Lack of Transportation (Medical): No  . Lack of Transportation (Non-Medical): No  Physical Activity: Sufficiently Active  . Days of Exercise per Week: 4 days  . Minutes of Exercise per Session: 40 min  Stress: No Stress Concern Present  . Feeling of Stress : Not at all  Social Connections: Moderately Integrated  . Frequency of Communication with Friends and Family: Twice a week  . Frequency of Social Gatherings with Friends and Family: More than three times a week  . Attends Religious Services: More than 4 times per year  . Active Member of Clubs or Organizations: No  . Attends Archivist Meetings: Never  . Marital Status: Married    Tobacco Counseling Counseling given: Not Answered Comment: not clear but did discuss AAA   Clinical Intake:  Pre-visit preparation completed: Yes  Pain : No/denies pain     Nutritional Risks: None Diabetes: No  How often do you need to have someone help you when you read instructions, pamphlets, or other written materials from your doctor or pharmacy?: 1 - Never What is the last grade level you completed in  school?: College  Diabetic?No  Interpreter Needed?: No  Information entered by :: [employer] of Daily Living In your present state of health, do you have any difficulty performing the following activities: 03/31/2020  Hearing? N  Vision? N  Difficulty concentrating or making decisions? N  Walking or climbing stairs? N  Dressing or bathing? N  Doing errands, shopping? N  Preparing Food and eating ? N  Using the Toilet? N  In the past six months, have you accidently leaked urine? N  Do you have problems with loss of bowel control? N  Managing your Medications? N  Managing your Finances? N  Housekeeping  or managing your Housekeeping? N  Some recent data might be hidden    Patient Care Team: Laurey Morale, MD as PCP - General Henrene Pastor Docia Chuck, MD as Consulting Physician (Gastroenterology) Dr. Ivory Broad (Proctology)  Indicate any recent Medical Services you may have received from other than [facility] providers in the past year (date may be approximate).     Assessment:   This is a routine wellness examination for [place].  Hearing/Vision screen  Hearing Screening   125Hz  250Hz  500Hz  1000Hz  2000Hz  3000Hz  4000Hz  6000Hz  8000Hz   Right ear:           Left ear:           Vision Screening Comments: Patient states eye exams yearly   Dietary issues and exercise activities discussed: Current Exercise Habits: Home exercise routine, Type of exercise: stretching;walking, Time (Minutes): 35, Frequency (Times/Week): 4, Weekly Exercise (Minutes/Week): 140, Intensity: Mild, Exercise limited by: None identified  Goals    . Patient Stated     Lose about 5 pounds by next year by continuing to drink more water, keeping up exercise routine.    . Patient Stated     I will continue to exercise3-5 days per week    . Weight (lb) < 155 lb (70.3 kg)     Try to diary your food intake for calorie consumption and energy expenditure   Try to do what you did the last time!       Depression Screen PHQ 2/9 Scores 03/31/2020 03/25/2020 08/21/2018 03/07/2018 03/06/2017  PHQ - 2 Score 0 0 0 0 0  PHQ- 9 Score 0 - 0 - -    Fall Risk Fall Risk  03/31/2020 03/25/2020 08/21/2018 03/07/2018 03/06/2017  Falls in the past year? 0 0 0 No No  Number falls in past yr: 0 0 - - -  Injury with Fall? 0 0 - - -  Risk for fall due to : No Fall Risks No Fall Risks Impaired balance/gait - -  Follow up Falls evaluation completed;Falls prevention discussed Falls evaluation completed Falls prevention discussed - -    Any stairs in or around the home? No  If so, are there any without handrails? No  Home free of loose throw rugs in walkways, pet beds, electrical cords, etc? Yes  Adequate lighting in your home to reduce risk of falls? Yes   ASSISTIVE DEVICES UTILIZED TO PREVENT FALLS:  Life alert? No  Use of a cane, walker or w/c? No  Grab bars in the bathroom? No  Shower chair or bench in shower? Yes  Elevated toilet seat or a handicapped toilet? No     Cognitive Function:  Cognition within normal limits based on direct observation      Immunizations Immunization History  Administered Date(s) Administered  . Fluad Quad(high Dose 65+) 03/23/2019, 03/25/2020  . Influenza Split 04/29/2012  . Influenza Whole 04/25/2007, 03/22/2008, 04/18/2009, 04/18/2010  . Influenza, High Dose Seasonal PF 04/01/2013, 03/05/2016, 03/06/2017, 03/07/2018  . Influenza,inj,Quad PF,6+ Mos 02/17/2014, 02/21/2015  . PFIZER SARS-COV-2 Vaccination 08/26/2019, 09/16/2019  . Pneumococcal Conjugate-13 02/17/2014  . Pneumococcal Polysaccharide-23 05/04/2008  . Tdap 08/27/2012  . Zoster 05/04/2008    TDAP status: Up to date Flu Vaccine status: Up to date Pneumococcal vaccine status: Up to date Covid-19 vaccine status: Completed vaccines  Qualifies for Shingles Vaccine? Yes   Zostavax completed Yes   Shingrix Completed?: No.    Education has been provided regarding the importance of this vaccine. Patient  has been advised to  call insurance company to determine out of pocket expense if they have not yet received this vaccine. Advised may also receive vaccine at local pharmacy or Health Dept. Verbalized acceptance and understanding.  Screening Tests Health Maintenance  Topic Date Due  . Hepatitis C Screening  Never done  . COLONOSCOPY  09/18/2021  . TETANUS/TDAP  08/28/2022  . INFLUENZA VACCINE  Completed  . COVID-19 Vaccine  Completed  . PNA vac Low Risk Adult  Completed    Health Maintenance  Health Maintenance Due  Topic Date Due  . Hepatitis C Screening  Never done    Colorectal cancer screening: Completed 09/18/2016. Repeat every 5 years  Lung Cancer Screening: (Low Dose CT Chest recommended if Age 96-80 years, 30 pack-year currently smoking OR have quit w/in 15years.) does not qualify.   Lung Cancer Screening Referral: N/A  Additional Screening:  Hepatitis C Screening: does qualify;   Vision Screening: Recommended annual ophthalmology exams for early detection of glaucoma and other disorders of the eye. Is the patient up to date with their annual eye exam?  Yes  Who is the provider or what is the name of the office in which the patient attends annual eye exams? [facility] If pt is not established with a provider, would they like to be referred to a provider to establish care? No .   Dental Screening: Recommended annual dental exams for proper oral hygiene  Community Resource Referral / Chronic Care Management: CRR required this visit?  No   CCM required this visit?  No      Plan:     I have personally reviewed and noted the following in the patient's chart:   . Medical and social history . Use of alcohol, tobacco or illicit drugs  . Current medications and supplements . Functional ability and status . Nutritional status . Physical activity . Advanced directives . List of other physicians . Hospitalizations, surgeries, and ER visits in previous 12  months . Vitals . Screenings to include cognitive, depression, and falls . Referrals and appointments  In addition, I have reviewed and discussed with patient certain preventive protocols, quality metrics, and best practice recommendations. A written personalized care plan for preventive services as well as general preventive health recommendations were provided to patient.     Ofilia Neas, LPN   [id number]   Nurse Notes: None

## 2020-04-07 ENCOUNTER — Ambulatory Visit
Admission: RE | Admit: 2020-04-07 | Discharge: 2020-04-07 | Disposition: A | Discharge status: Home / Self Care | Payer: Medicare Other | Source: Ambulatory Visit | Attending: Family Medicine | Admitting: Family Medicine

## 2020-04-07 ENCOUNTER — Encounter: Payer: Self-pay | Admitting: Family Medicine

## 2020-04-07 DIAGNOSIS — R918 Other nonspecific abnormal finding of lung field: Secondary | ICD-10-CM

## 2020-04-07 DIAGNOSIS — I2510 Atherosclerotic heart disease of native coronary artery without angina pectoris: Secondary | ICD-10-CM | POA: Diagnosis not present

## 2020-04-07 DIAGNOSIS — I700 Atherosclerosis of aorta: Secondary | ICD-10-CM | POA: Diagnosis not present

## 2020-04-08 NOTE — Telephone Encounter (Signed)
Noted  

## 2020-04-26 DIAGNOSIS — L814 Other melanin hyperpigmentation: Secondary | ICD-10-CM | POA: Diagnosis not present

## 2020-04-26 DIAGNOSIS — Z08 Encounter for follow-up examination after completed treatment for malignant neoplasm: Secondary | ICD-10-CM | POA: Diagnosis not present

## 2020-04-26 DIAGNOSIS — D225 Melanocytic nevi of trunk: Secondary | ICD-10-CM | POA: Diagnosis not present

## 2020-04-26 DIAGNOSIS — L821 Other seborrheic keratosis: Secondary | ICD-10-CM | POA: Diagnosis not present

## 2020-04-26 DIAGNOSIS — Z85828 Personal history of other malignant neoplasm of skin: Secondary | ICD-10-CM | POA: Diagnosis not present

## 2020-04-26 DIAGNOSIS — Z7189 Other specified counseling: Secondary | ICD-10-CM | POA: Diagnosis not present

## 2020-05-24 DIAGNOSIS — Z23 Encounter for immunization: Secondary | ICD-10-CM | POA: Diagnosis not present

## 2020-07-28 DIAGNOSIS — R972 Elevated prostate specific antigen [PSA]: Secondary | ICD-10-CM | POA: Diagnosis not present

## 2020-07-28 DIAGNOSIS — N401 Enlarged prostate with lower urinary tract symptoms: Secondary | ICD-10-CM | POA: Diagnosis not present

## 2020-08-24 DIAGNOSIS — R972 Elevated prostate specific antigen [PSA]: Secondary | ICD-10-CM | POA: Diagnosis not present

## 2020-08-24 DIAGNOSIS — N401 Enlarged prostate with lower urinary tract symptoms: Secondary | ICD-10-CM | POA: Diagnosis not present

## 2020-09-01 DIAGNOSIS — R972 Elevated prostate specific antigen [PSA]: Secondary | ICD-10-CM | POA: Diagnosis not present

## 2020-09-01 DIAGNOSIS — N401 Enlarged prostate with lower urinary tract symptoms: Secondary | ICD-10-CM | POA: Diagnosis not present

## 2020-10-31 DIAGNOSIS — Z08 Encounter for follow-up examination after completed treatment for malignant neoplasm: Secondary | ICD-10-CM | POA: Diagnosis not present

## 2020-10-31 DIAGNOSIS — L814 Other melanin hyperpigmentation: Secondary | ICD-10-CM | POA: Diagnosis not present

## 2020-10-31 DIAGNOSIS — Z7189 Other specified counseling: Secondary | ICD-10-CM | POA: Diagnosis not present

## 2020-10-31 DIAGNOSIS — D225 Melanocytic nevi of trunk: Secondary | ICD-10-CM | POA: Diagnosis not present

## 2020-10-31 DIAGNOSIS — L821 Other seborrheic keratosis: Secondary | ICD-10-CM | POA: Diagnosis not present

## 2020-10-31 DIAGNOSIS — L570 Actinic keratosis: Secondary | ICD-10-CM | POA: Diagnosis not present

## 2020-10-31 DIAGNOSIS — Z85828 Personal history of other malignant neoplasm of skin: Secondary | ICD-10-CM | POA: Diagnosis not present

## 2020-12-23 DIAGNOSIS — N401 Enlarged prostate with lower urinary tract symptoms: Secondary | ICD-10-CM | POA: Diagnosis not present

## 2020-12-23 DIAGNOSIS — R972 Elevated prostate specific antigen [PSA]: Secondary | ICD-10-CM | POA: Diagnosis not present

## 2020-12-29 DIAGNOSIS — R972 Elevated prostate specific antigen [PSA]: Secondary | ICD-10-CM | POA: Diagnosis not present

## 2020-12-29 DIAGNOSIS — N401 Enlarged prostate with lower urinary tract symptoms: Secondary | ICD-10-CM | POA: Diagnosis not present

## 2021-01-13 ENCOUNTER — Encounter: Payer: Self-pay | Admitting: Family Medicine

## 2021-01-13 NOTE — Telephone Encounter (Signed)
I do recommend he go ahead and get the second booster

## 2021-03-16 DIAGNOSIS — M3501 Sicca syndrome with keratoconjunctivitis: Secondary | ICD-10-CM | POA: Diagnosis not present

## 2021-03-16 DIAGNOSIS — D3131 Benign neoplasm of right choroid: Secondary | ICD-10-CM | POA: Diagnosis not present

## 2021-03-27 ENCOUNTER — Encounter: Payer: Self-pay | Admitting: Family Medicine

## 2021-03-27 ENCOUNTER — Ambulatory Visit (INDEPENDENT_AMBULATORY_CARE_PROVIDER_SITE_OTHER): Payer: Medicare Other | Admitting: Family Medicine

## 2021-03-27 ENCOUNTER — Other Ambulatory Visit: Payer: Self-pay

## 2021-03-27 VITALS — BP 120/70 | HR 63 | Temp 98.0°F | Ht 64.0 in | Wt 160.0 lb

## 2021-03-27 DIAGNOSIS — M159 Polyosteoarthritis, unspecified: Secondary | ICD-10-CM | POA: Diagnosis not present

## 2021-03-27 DIAGNOSIS — Z23 Encounter for immunization: Secondary | ICD-10-CM | POA: Diagnosis not present

## 2021-03-27 DIAGNOSIS — N401 Enlarged prostate with lower urinary tract symptoms: Secondary | ICD-10-CM

## 2021-03-27 DIAGNOSIS — E039 Hypothyroidism, unspecified: Secondary | ICD-10-CM

## 2021-03-27 DIAGNOSIS — I10 Essential (primary) hypertension: Secondary | ICD-10-CM | POA: Diagnosis not present

## 2021-03-27 DIAGNOSIS — N138 Other obstructive and reflux uropathy: Secondary | ICD-10-CM

## 2021-03-27 DIAGNOSIS — K219 Gastro-esophageal reflux disease without esophagitis: Secondary | ICD-10-CM | POA: Diagnosis not present

## 2021-03-27 DIAGNOSIS — R739 Hyperglycemia, unspecified: Secondary | ICD-10-CM

## 2021-03-27 DIAGNOSIS — R972 Elevated prostate specific antigen [PSA]: Secondary | ICD-10-CM | POA: Diagnosis not present

## 2021-03-27 DIAGNOSIS — E782 Mixed hyperlipidemia: Secondary | ICD-10-CM | POA: Diagnosis not present

## 2021-03-27 LAB — HEMOGLOBIN A1C: Hgb A1c MFr Bld: 5.4 % (ref 4.6–6.5)

## 2021-03-27 LAB — CBC WITH DIFFERENTIAL/PLATELET
Basophils Absolute: 0 10*3/uL (ref 0.0–0.1)
Basophils Relative: 0.7 % (ref 0.0–3.0)
Eosinophils Absolute: 0.1 10*3/uL (ref 0.0–0.7)
Eosinophils Relative: 2.2 % (ref 0.0–5.0)
HCT: 42.9 % (ref 39.0–52.0)
Hemoglobin: 14.9 g/dL (ref 13.0–17.0)
Lymphocytes Relative: 28.1 % (ref 12.0–46.0)
Lymphs Abs: 1.5 10*3/uL (ref 0.7–4.0)
MCHC: 34.7 g/dL (ref 30.0–36.0)
MCV: 91.7 fl (ref 78.0–100.0)
Monocytes Absolute: 0.5 10*3/uL (ref 0.1–1.0)
Monocytes Relative: 9.2 % (ref 3.0–12.0)
Neutro Abs: 3.3 10*3/uL (ref 1.4–7.7)
Neutrophils Relative %: 59.8 % (ref 43.0–77.0)
Platelets: 205 10*3/uL (ref 150.0–400.0)
RBC: 4.68 Mil/uL (ref 4.22–5.81)
RDW: 13 % (ref 11.5–15.5)
WBC: 5.5 10*3/uL (ref 4.0–10.5)

## 2021-03-27 LAB — HEPATIC FUNCTION PANEL
ALT: 17 U/L (ref 0–53)
AST: 19 U/L (ref 0–37)
Albumin: 4.3 g/dL (ref 3.5–5.2)
Alkaline Phosphatase: 52 U/L (ref 39–117)
Bilirubin, Direct: 0.2 mg/dL (ref 0.0–0.3)
Total Bilirubin: 0.9 mg/dL (ref 0.2–1.2)
Total Protein: 6.4 g/dL (ref 6.0–8.3)

## 2021-03-27 LAB — BASIC METABOLIC PANEL
BUN: 20 mg/dL (ref 6–23)
CO2: 29 mEq/L (ref 19–32)
Calcium: 9.7 mg/dL (ref 8.4–10.5)
Chloride: 104 mEq/L (ref 96–112)
Creatinine, Ser: 1.02 mg/dL (ref 0.40–1.50)
GFR: 70.64 mL/min (ref >60.00)
Glucose, Bld: 89 mg/dL (ref 70–99)
Potassium: 3.5 mEq/L (ref 3.5–5.1)
Sodium: 139 mEq/L (ref 135–145)

## 2021-03-27 LAB — LIPID PANEL
Cholesterol: 140 mg/dL (ref 0–200)
HDL: 47.4 mg/dL (ref >39.00)
LDL Cholesterol: 75 mg/dL (ref 0–99)
NonHDL: 93.07
Total CHOL/HDL Ratio: 3
Triglycerides: 88 mg/dL (ref 0.0–149.0)
VLDL: 17.6 mg/dL (ref 0.0–40.0)

## 2021-03-27 LAB — T3, FREE: T3, Free: 2.5 pg/mL (ref 2.3–4.2)

## 2021-03-27 LAB — TSH: TSH: 3.38 u[IU]/mL (ref 0.35–5.50)

## 2021-03-27 LAB — T4, FREE: Free T4: 1.04 ng/dL (ref 0.60–1.60)

## 2021-03-27 MED ORDER — HYDROCHLOROTHIAZIDE 25 MG PO TABS
25.0000 mg | ORAL_TABLET | Freq: Every day | ORAL | 3 refills | Status: DC
Start: 2021-03-27 — End: 2022-03-29

## 2021-03-27 MED ORDER — LEVOTHYROXINE SODIUM 75 MCG PO TABS
75.0000 ug | ORAL_TABLET | Freq: Every day | ORAL | 3 refills | Status: DC
Start: 2021-03-27 — End: 2022-03-29

## 2021-03-27 MED ORDER — POTASSIUM CHLORIDE ER 10 MEQ PO TBCR
10.0000 meq | EXTENDED_RELEASE_TABLET | Freq: Every day | ORAL | 3 refills | Status: DC
Start: 2021-03-27 — End: 2022-03-29

## 2021-03-27 MED ORDER — ROSUVASTATIN CALCIUM 10 MG PO TABS
10.0000 mg | ORAL_TABLET | Freq: Every day | ORAL | 3 refills | Status: DC
Start: 2021-03-27 — End: 2022-01-08

## 2021-03-27 MED ORDER — LOSARTAN POTASSIUM 100 MG PO TABS
100.0000 mg | ORAL_TABLET | Freq: Every day | ORAL | 3 refills | Status: DC
Start: 2021-03-27 — End: 2022-03-29

## 2021-03-27 NOTE — Progress Notes (Signed)
Subjective:    Patient ID: Mason [name], male    DOB: Jan 14, 1941, 80 y.o.   MRN: [id number]  HPI Here to follow up on issues. He feels well except for some stiffness and pain in his back. He takes Ibuprofen ocassionally. His BP is stable. He spends about half his time in [place], [place], and he recently went to a Urologist nearby in [place], [place] named Dr. Derry Skill. He has a long hx of elevated PSA levels as high as 14. He had been seeing Dr. Harlow Asa here, but he has decided to switch to Dr. Araceli Bouche. He has had several biopsies which have all been negative, and on 08-24-20 he had an MRI of the prostate which showed only BPH. He will see her again in a few months.   Review of Systems  Constitutional: Negative.   HENT: Negative.    Eyes: Negative.   Respiratory: Negative.    Cardiovascular: Negative.   Gastrointestinal: Negative.   Genitourinary: Negative.   Musculoskeletal:  Positive for arthralgias.  Skin: Negative.   Neurological: Negative.   Psychiatric/Behavioral: Negative.        Objective:   Physical Exam Constitutional:      General: He is not in acute distress.    Appearance: Normal appearance. He is well-developed. He is not diaphoretic.  HENT:     Head: Normocephalic and atraumatic.     Right Ear: External ear normal.     Left Ear: External ear normal.     Nose: Nose normal.     Mouth/Throat:     Pharynx: No oropharyngeal exudate.  Eyes:     General: No scleral icterus.       Right eye: No discharge.        Left eye: No discharge.     Conjunctiva/sclera: Conjunctivae normal.     Pupils: Pupils are equal, round, and reactive to light.  Neck:     Thyroid: No thyromegaly.     Vascular: No JVD.     Trachea: No tracheal deviation.  Cardiovascular:     Rate and Rhythm: Normal rate and regular rhythm.     Heart sounds: Normal heart sounds. No murmur heard.   No friction rub. No gallop.  Pulmonary:     Effort: Pulmonary effort is normal. No respiratory distress.      Breath sounds: Normal breath sounds. No wheezing or rales.  Chest:     Chest wall: No tenderness.  Abdominal:     General: Bowel sounds are normal. There is no distension.     Palpations: Abdomen is soft. There is no mass.     Tenderness: There is no abdominal tenderness. There is no guarding or rebound.  Genitourinary:    Penis: No tenderness.   Musculoskeletal:        General: No tenderness. Normal range of motion.     Cervical back: Neck supple.  Lymphadenopathy:     Cervical: No cervical adenopathy.  Skin:    General: Skin is warm and dry.     Coloration: Skin is not pale.     Findings: No erythema or rash.  Neurological:     Mental Status: He is alert and oriented to person, place, and time.     Cranial Nerves: No cranial nerve deficit.     Motor: No abnormal muscle tone.     Coordination: Coordination normal.     Deep Tendon Reflexes: Reflexes are normal and symmetric. Reflexes normal.  Psychiatric:  Behavior: Behavior normal.        Thought Content: Thought content normal.        Judgment: Judgment normal.          Assessment & Plan:  His BPH seems to be stable, and he will follow up with Dr. Araceli Bouche in [place] as above. His HTN and GERD are stable. His OA is stable. We will get fasting labs today to check lipids, a thyroid panel ,etc. We spent a total of ( 33  ) minutes reviewing records and discussing these issues.  Alysia Penna, MD  Alysia Penna, MD

## 2021-03-27 NOTE — Addendum Note (Signed)
Addended by: Amanda Cockayne on: 03/27/2021 08:38 AM   Modules accepted: Orders

## 2021-03-27 NOTE — Addendum Note (Signed)
Addended by: Wyvonne Lenz on: 03/27/2021 08:43 AM   Modules accepted: Orders

## 2021-04-18 ENCOUNTER — Telehealth: Payer: Self-pay | Admitting: Family Medicine

## 2021-04-18 ENCOUNTER — Encounter: Payer: Self-pay | Admitting: Family Medicine

## 2021-04-18 NOTE — Telephone Encounter (Signed)
Set up a virtual OV so we can discuss this

## 2021-04-18 NOTE — Telephone Encounter (Signed)
Left message for patient to call back and schedule Medicare Annual Wellness Visit (AWV) either virtually or in office. Left  my Herbie Drape number [phone]   Last AWV 03/31/20  please schedule at anytime with [facility] Nurse Health Advisor 1 or 2   This should be a 45 minute visit.

## 2021-04-19 ENCOUNTER — Telehealth (INDEPENDENT_AMBULATORY_CARE_PROVIDER_SITE_OTHER): Payer: Medicare Other | Admitting: Family Medicine

## 2021-04-19 ENCOUNTER — Encounter: Payer: Self-pay | Admitting: Family Medicine

## 2021-04-19 VITALS — Temp 98.2°F

## 2021-04-19 DIAGNOSIS — U071 COVID-19: Secondary | ICD-10-CM | POA: Insufficient documentation

## 2021-04-19 MED ORDER — NIRMATRELVIR/RITONAVIR (PAXLOVID)TABLET: 3.0000 | ORAL_TABLET | Freq: Two times a day (BID) | ORAL | 0 refills | Status: AC

## 2021-04-19 NOTE — Progress Notes (Signed)
Subjective:    Patient ID: Mason [name], male    DOB: 22-Jul-1940, 80 y.o.   MRN: [id number]  HPI Virtual Visit via Video Note  I connected with the patient on 04/19/21 at  1:00 PM EDT by a video enabled telemedicine application and verified that I am speaking with the correct person using two identifiers.  Location patient: home Location provider:work or home office Persons participating in the virtual visit: patient, provider  I discussed the limitations of evaluation and management by telemedicine and the availability of in person appointments. The patient expressed understanding and agreed to proceed.   HPI: Here for a Covid-infection. 3 days ago he developed runny nose and a dry cough. No body aches or fever. No SOB or NVD. Drinking fluids and taking Advil. He tested positive for the virus 2 days ago.    ROS: See pertinent positives and negatives per HPI.  Past Medical History:  Diagnosis Date   Benign prostatic hypertrophy with elevated PSA    sees Dr. Ivory Broad in [facility]    GERD (gastroesophageal reflux disease)    Hyperlipidemia    Hypertension    Normal cardiac stress test 09-04-12   Osteoporosis    Thyroid disease    hypothyroidism    Past Surgical History:  Procedure Laterality Date   BASAL CELL CARCINOMA EXCISION  2017   off nose, per Dr. Leilani Merl    COLONOSCOPY  09/18/2016   per Dr. Henrene Pastor, adenomatous polyp, repeat in 5 yrs   PROSTATE BIOPSY     x6   TONSILLECTOMY     WISDOM TOOTH EXTRACTION      Family History  Problem Relation Age of Onset   Heart disease Mother    Heart disease Father    Colon cancer Brother        late 50's   Coronary artery disease Other        family hx   Aneurysm Other        family hx   Cancer Other        colon   Colon cancer Other        family hx   Esophageal cancer Neg Hx    Stomach cancer Neg Hx      Current Outpatient Medications:    hydrochlorothiazide (HYDRODIURIL) 25 MG tablet, Take 1 tablet (25 mg  total) by mouth daily., Disp: 90 tablet, Rfl: 3   levothyroxine (SYNTHROID) 75 MCG tablet, Take 1 tablet (75 mcg total) by mouth daily., Disp: 90 tablet, Rfl: 3   losartan (COZAAR) 100 MG tablet, Take 1 tablet (100 mg total) by mouth daily., Disp: 90 tablet, Rfl: 3   nirmatrelvir/ritonavir EUA (PAXLOVID) 20 x 150 MG & 10 x 100MG  TABS, Take 3 tablets by mouth 2 (two) times daily for 5 days. (Take nirmatrelvir 150 mg two tablets twice daily for 5 days and ritonavir 100 mg one tablet twice daily for 5 days) Patient GFR is 70, Disp: 30 tablet, Rfl: 0   potassium chloride (KLOR-CON 10) 10 MEQ tablet, Take 1 tablet (10 mEq total) by mouth daily., Disp: 90 tablet, Rfl: 3   rosuvastatin (CRESTOR) 10 MG tablet, Take 1 tablet (10 mg total) by mouth daily., Disp: 90 tablet, Rfl: 3  Current Facility-Administered Medications:    0.9 %  sodium chloride infusion, 500 mL, Intravenous, Continuous, Irene Shipper, MD  EXAM:  VITALS per patient if applicable:  GENERAL: alert, oriented, appears well and in no acute distress  HEENT: atraumatic, conjunttiva  clear, no obvious abnormalities on inspection of external nose and ears  NECK: normal movements of the head and neck  LUNGS: on inspection no signs of respiratory distress, breathing rate appears normal, no obvious gross SOB, gasping or wheezing  CV: no obvious cyanosis  MS: moves all visible extremities without noticeable abnormality  PSYCH/NEURO: pleasant and cooperative, no obvious depression or anxiety, speech and thought processing grossly intact  ASSESSMENT AND PLAN: Covid-19 infection. Treat with 5 days of Paxlovid. He will quarantine for 5 days. Recheck as needed.  Alysia Penna, MD  Discussed the following assessment and plan:  No diagnosis found.     I discussed the assessment and treatment plan with the patient. The patient was provided an opportunity to ask questions and all were answered. The patient agreed with the plan and demonstrated  an understanding of the instructions.   The patient was advised to call back or seek an in-person evaluation if the symptoms worsen or if the condition fails to improve as anticipated.      Review of Systems     Objective:   Physical Exam        Assessment & Plan:

## 2021-05-02 ENCOUNTER — Encounter: Payer: Self-pay | Admitting: Family Medicine

## 2021-05-09 ENCOUNTER — Ambulatory Visit (INDEPENDENT_AMBULATORY_CARE_PROVIDER_SITE_OTHER): Payer: Medicare Other

## 2021-05-09 VITALS — Ht 64.0 in | Wt 155.0 lb

## 2021-05-09 DIAGNOSIS — Z0000 Encounter for general adult medical examination without abnormal findings: Secondary | ICD-10-CM

## 2021-05-09 NOTE — Patient Instructions (Signed)
[name] , Thank you for taking time to come for your Medicare Wellness Visit. I appreciate your ongoing commitment to your health goals. Please review the following plan we discussed and let me know if I can assist you in the future.   Screening recommendations/referrals: Colonoscopy: completed 09/18/2016 Recommended yearly ophthalmology/optometry visit for glaucoma screening and checkup Recommended yearly dental visit for hygiene and checkup  Vaccinations: Influenza vaccine: completed 03/27/2021 Pneumococcal vaccine: completed 02/17/2014 Tdap vaccine: completed 08/27/2012, due 08/28/2022 Shingles vaccine: discussed   Covid-19:  11/24/201, 09/16/2019, 08/26/2019  Advanced directives: Please bring a copy of your POA (Power of Attorney) and/or Living Will to your next appointment.   Conditions/risks identified: none  Next appointment: Follow up in one year for your annual wellness visit.   Preventive Care 78 Years and Older, Male Preventive care refers to lifestyle choices and visits with your health care provider that can promote health and wellness. What does preventive care include? A yearly physical exam. This is also called an annual well check. Dental exams once or twice a year. Routine eye exams. Ask your health care provider how often you should have your eyes checked. Personal lifestyle choices, including: Daily care of your teeth and gums. Regular physical activity. Eating a healthy diet. Avoiding tobacco and drug use. Limiting alcohol use. Practicing safe sex. Taking low doses of aspirin every day. Taking vitamin and mineral supplements as recommended by your health care provider. What happens during an annual well check? The services and screenings done by your health care provider during your annual well check will depend on your age, overall health, lifestyle risk factors, and family history of disease. Counseling  Your health care provider may ask you questions about  your: Alcohol use. Tobacco use. Drug use. Emotional well-being. Home and relationship well-being. Sexual activity. Eating habits. History of falls. Memory and ability to understand (cognition). Work and work Statistician. Screening  You may have the following tests or measurements: Height, weight, and BMI. Blood pressure. Lipid and cholesterol levels. These may be checked every 5 years, or more frequently if you are over 14 years old. Skin check. Lung cancer screening. You may have this screening every year starting at age 25 if you have a 30-pack-year history of smoking and currently smoke or have quit within the past 15 years. Fecal occult blood test (FOBT) of the stool. You may have this test every year starting at age 56. Flexible sigmoidoscopy or colonoscopy. You may have a sigmoidoscopy every 5 years or a colonoscopy every 10 years starting at age 104. Prostate cancer screening. Recommendations will vary depending on your family history and other risks. Hepatitis C blood test. Hepatitis B blood test. Sexually transmitted disease (STD) testing. Diabetes screening. This is done by checking your blood sugar (glucose) after you have not eaten for a while (fasting). You may have this done every 1-3 years. Abdominal aortic aneurysm (AAA) screening. You may need this if you are a current or former smoker. Osteoporosis. You may be screened starting at age 63 if you are at high risk. Talk with your health care provider about your test results, treatment options, and if necessary, the need for more tests. Vaccines  Your health care provider may recommend certain vaccines, such as: Influenza vaccine. This is recommended every year. Tetanus, diphtheria, and acellular pertussis (Tdap, Td) vaccine. You may need a Td booster every 10 years. Zoster vaccine. You may need this after age 49. Pneumococcal 13-valent conjugate (PCV13) vaccine. One dose is recommended  after age 21. Pneumococcal  polysaccharide (PPSV23) vaccine. One dose is recommended after age 42. Talk to your health care provider about which screenings and vaccines you need and how often you need them. This information is not intended to replace advice given to you by your health care provider. Make sure you discuss any questions you have with your health care provider. Document Released: 07/08/2015 Document Revised: 02/29/2016 Document Reviewed: 04/12/2015 Elsevier Interactive Patient Education  2017 [place] Prevention in the Home Falls can cause injuries. They can happen to people of all ages. There are many things you can do to make your home safe and to help prevent falls. What can I do on the outside of my home? Regularly fix the edges of walkways and driveways and fix any cracks. Remove anything that might make you trip as you walk through a door, such as a raised step or threshold. Trim any bushes or trees on the path to your home. Use bright outdoor lighting. Clear any walking paths of anything that might make someone trip, such as rocks or tools. Regularly check to see if handrails are loose or broken. Make sure that both sides of any steps have handrails. Any raised decks and porches should have guardrails on the edges. Have any leaves, snow, or ice cleared regularly. Use sand or salt on walking paths during winter. Clean up any spills in your garage right away. This includes oil or grease spills. What can I do in the bathroom? Use night lights. Install grab bars by the toilet and in the tub and shower. Do not use towel bars as grab bars. Use non-skid mats or decals in the tub or shower. If you need to sit down in the shower, use a plastic, non-slip stool. Keep the floor dry. Clean up any water that spills on the floor as soon as it happens. Remove soap buildup in the tub or shower regularly. Attach bath mats securely with double-sided non-slip rug tape. Do not have throw rugs and other  things on the floor that can make you trip. What can I do in the bedroom? Use night lights. Make sure that you have a light by your bed that is easy to reach. Do not use any sheets or blankets that are too big for your bed. They should not hang down onto the floor. Have a firm chair that has side arms. You can use this for support while you get dressed. Do not have throw rugs and other things on the floor that can make you trip. What can I do in the kitchen? Clean up any spills right away. Avoid walking on wet floors. Keep items that you use a lot in easy-to-reach places. If you need to reach something above you, use a strong step stool that has a grab bar. Keep electrical cords out of the way. Do not use floor polish or wax that makes floors slippery. If you must use wax, use non-skid floor wax. Do not have throw rugs and other things on the floor that can make you trip. What can I do with my stairs? Do not leave any items on the stairs. Make sure that there are handrails on both sides of the stairs and use them. Fix handrails that are broken or loose. Make sure that handrails are as long as the stairways. Check any carpeting to make sure that it is firmly attached to the stairs. Fix any carpet that is loose or worn. Avoid having throw  rugs at the top or bottom of the stairs. If you do have throw rugs, attach them to the floor with carpet tape. Make sure that you have a light switch at the top of the stairs and the bottom of the stairs. If you do not have them, ask someone to add them for you. What else can I do to help prevent falls? Wear shoes that: Do not have high heels. Have rubber bottoms. Are comfortable and fit you well. Are closed at the toe. Do not wear sandals. If you use a stepladder: Make sure that it is fully opened. Do not climb a closed stepladder. Make sure that both sides of the stepladder are locked into place. Ask someone to hold it for you, if possible. Clearly  mark and make sure that you can see: Any grab bars or handrails. First and last steps. Where the edge of each step is. Use tools that help you move around (mobility aids) if they are needed. These include: Canes. Walkers. Scooters. Crutches. Turn on the lights when you go into a dark area. Replace any light bulbs as soon as they burn out. Set up your furniture so you have a clear path. Avoid moving your furniture around. If any of your floors are uneven, fix them. If there are any pets around you, be aware of where they are. Review your medicines with your doctor. Some medicines can make you feel dizzy. This can increase your chance of falling. Ask your doctor what other things that you can do to help prevent falls. This information is not intended to replace advice given to you by your health care provider. Make sure you discuss any questions you have with your health care provider. Document Released: 04/07/2009 Document Revised: 11/17/2015 Document Reviewed: 07/16/2014 Elsevier Interactive Patient Education  2017 [employer].

## 2021-05-09 NOTE — Progress Notes (Signed)
I connected with  Mason [name] today via telehealth video enabled device and verified that I am speaking with the correct person using two identifiers.   Location: Patient: home Provider: work  Persons participating in virtual visit: Mason [name], Mason Durand LPN  I discussed the limitations, risks, security and privacy concerns of performing an evaluation and management service by telephone and the availability of in person appointments. The patient expressed understanding and agreed to proceed.   Some vital signs may be absent or patient reported.     Subjective:   Mason [name] is a 80 y.o. male who presents for Medicare Annual/Subsequent preventive examination.  Review of Systems     Cardiac Risk Factors include: advanced age (>28men, >28 women);dyslipidemia;hypertension;male gender     Objective:    Today's Vitals   05/09/21 1217  Weight: 155 lb (70.3 kg)  Height: 5\' 4"  (1.626 m)   Body mass index is 26.61 kg/m.  Advanced Directives 05/09/2021 03/31/2020 08/21/2018 03/06/2017 09/18/2016 09/04/2016  Does Patient Have a Medical Advance Directive? Yes Yes Yes Yes Yes Yes  Type of Paramedic of [place];Living will [place];Living will [place];Living will - - [place];Living will  Does patient want to make changes to medical advance directive? - No - Patient declined - - - -  Copy of [place] in Chart? No - copy requested - No - copy requested - - No - copy requested    Current Medications (verified) Outpatient Encounter Medications as of 05/09/2021  Medication Sig   hydrochlorothiazide (HYDRODIURIL) 25 MG tablet Take 1 tablet (25 mg total) by mouth daily.   levothyroxine (SYNTHROID) 75 MCG tablet Take 1 tablet (75 mcg total) by mouth daily.   losartan (COZAAR) 100 MG tablet Take 1 tablet (100 mg total) by mouth daily.   potassium chloride (KLOR-CON 10) 10  MEQ tablet Take 1 tablet (10 mEq total) by mouth daily.   rosuvastatin (CRESTOR) 10 MG tablet Take 1 tablet (10 mg total) by mouth daily.   Facility-Administered Encounter Medications as of 05/09/2021  Medication   0.9 %  sodium chloride infusion    Allergies (verified) Patient has no known allergies.   History: Past Medical History:  Diagnosis Date   Benign prostatic hypertrophy with elevated PSA    sees Dr. Ivory Broad in [facility]    GERD (gastroesophageal reflux disease)    Hyperlipidemia    Hypertension    Normal cardiac stress test 09-04-12   Osteoporosis    Thyroid disease    hypothyroidism   Past Surgical History:  Procedure Laterality Date   BASAL CELL CARCINOMA EXCISION  2017   off nose, per Dr. Leilani Merl    COLONOSCOPY  09/18/2016   per Dr. Henrene Pastor, adenomatous polyp, repeat in 5 yrs   PROSTATE BIOPSY     x6   TONSILLECTOMY     WISDOM TOOTH EXTRACTION     Family History  Problem Relation Age of Onset   Heart disease Mother    Heart disease Father    Colon cancer Brother        late 50's   Coronary artery disease Other        family hx   Aneurysm Other        family hx   Cancer Other        colon   Colon cancer Other        family hx   Esophageal cancer  Neg Hx    Stomach cancer Neg Hx    Social History   Socioeconomic History   Marital status: Married    Spouse name: Not on file   Number of children: 2   Years of education: Not on file   Highest education level: Not on file  Occupational History   Occupation: Engineering/business    Comment: Therapist, art, retired  Tobacco Use   Smoking status: Former    Types: Cigarettes    Quit date: 06/25/1968    Years since quitting: 52.9   Smokeless tobacco: Never   Tobacco comments:    not clear but did discuss AAA  Vaping Use   Vaping Use: Never used  Substance and Sexual Activity   Alcohol use: No    Alcohol/week: 0.0 standard drinks   Drug use: No   Sexual activity: Not on file  Other Topics Concern   Not  on file  Social History Narrative   Lives with wife on one level   Has daughter in [place], one daughter in [place] with four grandchildren   Enjoys travelling with wife; goes to house in [facility] frequently   Attends bible study    Exercises at gym and/or outside routinely, doing yoga, biking   Social Determinants of Health   Financial Resource Strain: Low Risk    Difficulty of Paying Living Expenses: Not hard at all  Food Insecurity: No Food Insecurity   Worried About Charity fundraiser in the Last Year: Never true   Arboriculturist in the Last Year: Never true  Transportation Needs: No Transportation Needs   Lack of Transportation (Medical): No   Lack of Transportation (Non-Medical): No  Physical Activity: Sufficiently Active   Days of Exercise per Week: 3 days   Minutes of Exercise per Session: 60 min  Stress: No Stress Concern Present   Feeling of Stress : Not at all  Social Connections: Not on file    Tobacco Counseling Counseling given: Not Answered Tobacco comments: not clear but did discuss AAA   Clinical Intake:  Pre-visit preparation completed: Yes  Pain : No/denies pain     Nutritional Status: BMI 25 -29 Overweight Nutritional Risks: None Diabetes: No  How often do you need to have someone help you when you read instructions, pamphlets, or other written materials from your doctor or pharmacy?: 1 - Never What is the last grade level you completed in school?: college  Diabetic? no  Interpreter Needed?: No  Information entered by :: NAllen LPN   Activities of Daily Living In your present state of health, do you have any difficulty performing the following activities: 05/09/2021 03/27/2021  Hearing? N N  Vision? N N  Difficulty concentrating or making decisions? N N  Walking or climbing stairs? N N  Dressing or bathing? N N  Doing errands, shopping? N N  Preparing Food and eating ? N -  Using the Toilet? N -  In the past six months, have you accidently  leaked urine? N -  Do you have problems with loss of bowel control? N -  Managing your Medications? N -  Managing your Finances? N -  Housekeeping or managing your Housekeeping? N -  Some recent data might be hidden    Patient Care Team: Laurey Morale, MD as PCP - Gwenevere Abbot Docia Chuck, MD as Consulting Physician (Gastroenterology) Dr. Ivory Broad (Proctology)  Indicate any recent Medical Services you may have received from other than [facility] providers in the past  year (date may be approximate).     Assessment:   This is a routine wellness examination for [place].  Hearing/Vision screen Vision Screening - Comments:: Regular eye exams, [facility]  Dietary issues and exercise activities discussed: Current Exercise Habits: Home exercise routine, Type of exercise: walking, Time (Minutes): 60, Frequency (Times/Week): 3, Weekly Exercise (Minutes/Week): 180   Goals Addressed             This Visit's Progress    Patient Stated       05/09/2021, continue to monitor prostate       Depression Screen PHQ 2/9 Scores 05/09/2021 03/27/2021 03/31/2020 03/25/2020 08/21/2018 03/07/2018 03/06/2017  PHQ - 2 Score 0 0 0 0 0 0 0  PHQ- 9 Score - 0 0 - 0 - -    Fall Risk Fall Risk  05/09/2021 05/08/2021 03/27/2021 03/31/2020 03/25/2020  Falls in the past year? 0 0 0 0 0  Number falls in past yr: - - 0 0 0  Injury with Fall? - - 0 0 0  Risk for fall due to : Medication side effect - No Fall Risks No Fall Risks No Fall Risks  Follow up Falls evaluation completed;Education provided;Falls prevention discussed - - Falls evaluation completed;Falls prevention discussed Falls evaluation completed    FALL RISK PREVENTION PERTAINING TO THE HOME:  Any stairs in or around the home? No  If so, are there any without handrails? N/a Home free of loose throw rugs in walkways, pet beds, electrical cords, etc? Yes  Adequate lighting in your home to reduce risk of falls? Yes   ASSISTIVE DEVICES UTILIZED  TO PREVENT FALLS:  Life alert? No  Use of a cane, walker or w/c? No  Grab bars in the bathroom? No  Shower chair or bench in shower? Yes  Elevated toilet seat or a handicapped toilet? Yes   TIMED UP AND GO:  Was the test performed? No .      Cognitive Function:     6CIT Screen 05/09/2021  What Year? 0 points  What month? 0 points  What time? 0 points  Count back from 20 0 points  Months in reverse 0 points  Repeat phrase 2 points  Total Score 2    Immunizations Immunization History  Administered Date(s) Administered   Fluad Quad(high Dose 65+) 03/23/2019, 03/25/2020, 03/27/2021   Influenza Split 04/29/2012   Influenza Whole 04/25/2007, 03/22/2008, 04/18/2009, 04/18/2010   Influenza, High Dose Seasonal PF 04/01/2013, 03/05/2016, 03/06/2017, 03/07/2018   Influenza,inj,Quad PF,6+ Mos 02/17/2014, 02/21/2015   PFIZER(Purple Top)SARS-COV-2 Vaccination 08/26/2019, 09/16/2019, 05/18/2020   Pneumococcal Conjugate-13 02/17/2014   Pneumococcal Polysaccharide-23 05/04/2008   Tdap 08/27/2012   Zoster, Live 05/04/2008    TDAP status: Up to date  Flu Vaccine status: Up to date  Pneumococcal vaccine status: Up to date  Covid-19 vaccine status: Completed vaccines  Qualifies for Shingles Vaccine? Yes   Zostavax completed Yes   Shingrix Completed?: No.    Education has been provided regarding the importance of this vaccine. Patient has been advised to call insurance company to determine out of pocket expense if they have not yet received this vaccine. Advised may also receive vaccine at local pharmacy or Health Dept. Verbalized acceptance and understanding.  Screening Tests Health Maintenance  Topic Date Due   Hepatitis C Screening  Never done   Zoster Vaccines- Shingrix (1 of 2) Never done   COVID-19 Vaccine (4 - Booster for Pfizer series) 07/13/2020   COLONOSCOPY (Pts 45-48yrs Insurance coverage will  need to be confirmed)  09/18/2021   TETANUS/TDAP  08/28/2022    Pneumonia Vaccine 19+ Years old  Completed   INFLUENZA VACCINE  Completed   HPV VACCINES  Aged Out    Health Maintenance  Health Maintenance Due  Topic Date Due   Hepatitis C Screening  Never done   Zoster Vaccines- Shingrix (1 of 2) Never done   COVID-19 Vaccine (4 - Booster for Pfizer series) 07/13/2020    Colorectal cancer screening: Type of screening: Colonoscopy. Completed 09/18/2016. Repeat every 5 years  Lung Cancer Screening: (Low Dose CT Chest recommended if Age 56-80 years, 30 pack-year currently smoking OR have quit w/in 15years.) does not qualify.   Lung Cancer Screening Referral: no  Additional Screening:  Hepatitis C Screening: does qualify;   Vision Screening: Recommended annual ophthalmology exams for early detection of glaucoma and other disorders of the eye. Is the patient up to date with their annual eye exam?  Yes  Who is the provider or what is the name of the office in which the patient attends annual eye exams? [facility] If pt is not established with a provider, would they like to be referred to a provider to establish care? No .   Dental Screening: Recommended annual dental exams for proper oral hygiene  Community Resource Referral / Chronic Care Management: CRR required this visit?  No   CCM required this visit?  No      Plan:     I have personally reviewed and noted the following in the patient's chart:   Medical and social history Use of alcohol, tobacco or illicit drugs  Current medications and supplements including opioid prescriptions. Patient is not currently taking opioid prescriptions. Functional ability and status Nutritional status Physical activity Advanced directives List of other physicians Hospitalizations, surgeries, and ER visits in previous 12 months Vitals Screenings to include cognitive, depression, and falls Referrals and appointments  In addition, I have reviewed and discussed with patient certain  preventive protocols, quality metrics, and best practice recommendations. A written personalized care plan for preventive services as well as general preventive health recommendations were provided to patient.     Kellie Simmering, LPN   [id number]   Nurse Notes: none

## 2021-06-22 DIAGNOSIS — N401 Enlarged prostate with lower urinary tract symptoms: Secondary | ICD-10-CM | POA: Diagnosis not present

## 2021-06-22 DIAGNOSIS — R972 Elevated prostate specific antigen [PSA]: Secondary | ICD-10-CM | POA: Diagnosis not present

## 2021-06-29 DIAGNOSIS — N401 Enlarged prostate with lower urinary tract symptoms: Secondary | ICD-10-CM | POA: Diagnosis not present

## 2021-06-29 DIAGNOSIS — R972 Elevated prostate specific antigen [PSA]: Secondary | ICD-10-CM | POA: Diagnosis not present

## 2021-07-10 DIAGNOSIS — L821 Other seborrheic keratosis: Secondary | ICD-10-CM | POA: Diagnosis not present

## 2021-07-10 DIAGNOSIS — L570 Actinic keratosis: Secondary | ICD-10-CM | POA: Diagnosis not present

## 2021-07-10 DIAGNOSIS — Z85828 Personal history of other malignant neoplasm of skin: Secondary | ICD-10-CM | POA: Diagnosis not present

## 2021-07-10 DIAGNOSIS — L728 Other follicular cysts of the skin and subcutaneous tissue: Secondary | ICD-10-CM | POA: Diagnosis not present

## 2021-07-10 DIAGNOSIS — D225 Melanocytic nevi of trunk: Secondary | ICD-10-CM | POA: Diagnosis not present

## 2021-07-10 DIAGNOSIS — Z08 Encounter for follow-up examination after completed treatment for malignant neoplasm: Secondary | ICD-10-CM | POA: Diagnosis not present

## 2021-07-10 DIAGNOSIS — L814 Other melanin hyperpigmentation: Secondary | ICD-10-CM | POA: Diagnosis not present

## 2021-07-10 DIAGNOSIS — Z7189 Other specified counseling: Secondary | ICD-10-CM | POA: Diagnosis not present

## 2021-10-03 ENCOUNTER — Encounter: Payer: Self-pay | Admitting: Internal Medicine

## 2021-10-12 DIAGNOSIS — N401 Enlarged prostate with lower urinary tract symptoms: Secondary | ICD-10-CM | POA: Diagnosis not present

## 2021-10-12 DIAGNOSIS — R972 Elevated prostate specific antigen [PSA]: Secondary | ICD-10-CM | POA: Diagnosis not present

## 2021-10-26 DIAGNOSIS — N401 Enlarged prostate with lower urinary tract symptoms: Secondary | ICD-10-CM | POA: Diagnosis not present

## 2021-10-26 DIAGNOSIS — R972 Elevated prostate specific antigen [PSA]: Secondary | ICD-10-CM | POA: Diagnosis not present

## 2022-01-08 ENCOUNTER — Other Ambulatory Visit: Payer: Self-pay | Admitting: Family Medicine

## 2022-01-08 DIAGNOSIS — L814 Other melanin hyperpigmentation: Secondary | ICD-10-CM | POA: Diagnosis not present

## 2022-01-08 DIAGNOSIS — D225 Melanocytic nevi of trunk: Secondary | ICD-10-CM | POA: Diagnosis not present

## 2022-01-08 DIAGNOSIS — L821 Other seborrheic keratosis: Secondary | ICD-10-CM | POA: Diagnosis not present

## 2022-01-08 DIAGNOSIS — D485 Neoplasm of uncertain behavior of skin: Secondary | ICD-10-CM | POA: Diagnosis not present

## 2022-01-08 DIAGNOSIS — Z85828 Personal history of other malignant neoplasm of skin: Secondary | ICD-10-CM | POA: Diagnosis not present

## 2022-01-08 DIAGNOSIS — B078 Other viral warts: Secondary | ICD-10-CM | POA: Diagnosis not present

## 2022-01-08 DIAGNOSIS — Z08 Encounter for follow-up examination after completed treatment for malignant neoplasm: Secondary | ICD-10-CM | POA: Diagnosis not present

## 2022-01-08 DIAGNOSIS — Z7189 Other specified counseling: Secondary | ICD-10-CM | POA: Diagnosis not present

## 2022-01-08 DIAGNOSIS — L570 Actinic keratosis: Secondary | ICD-10-CM | POA: Diagnosis not present

## 2022-02-13 DIAGNOSIS — N401 Enlarged prostate with lower urinary tract symptoms: Secondary | ICD-10-CM | POA: Diagnosis not present

## 2022-03-08 IMAGING — CT CT CHEST W/O CM
2 of 4 series · 15 of 36 positions shown, 18 images · non-contrast
Comparison: 03/20/2018

CLINICAL DATA: Lung nodule

EXAM:
CT CHEST WITHOUT CONTRAST
TECHNIQUE: Multidetector CT imaging of the chest was performed following the
standard protocol without IV contrast.

[Series 2: chest 2.00 br40 s3 · axial · 0.72mm/px · z∈[+1503,+1787]mm · 12 of 168 slices shown, 15 images (1 of 2)]
[im 13/168  mediastinal]
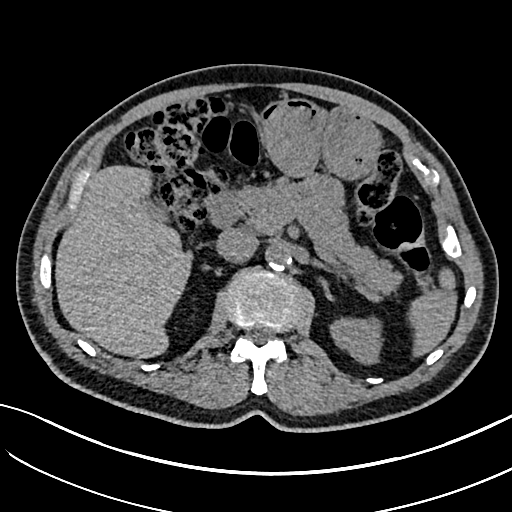
[im 13/168  lung]
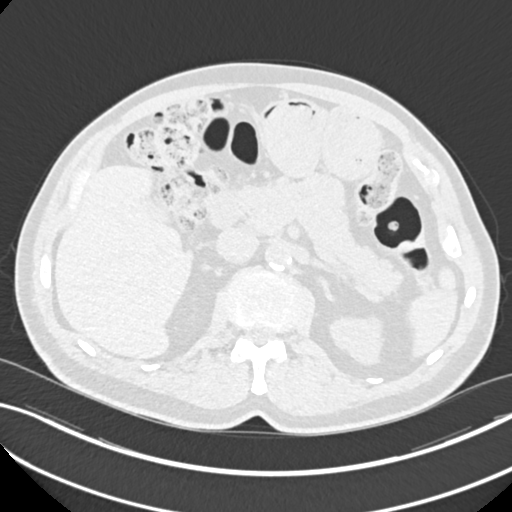
[im 26/168  lung]
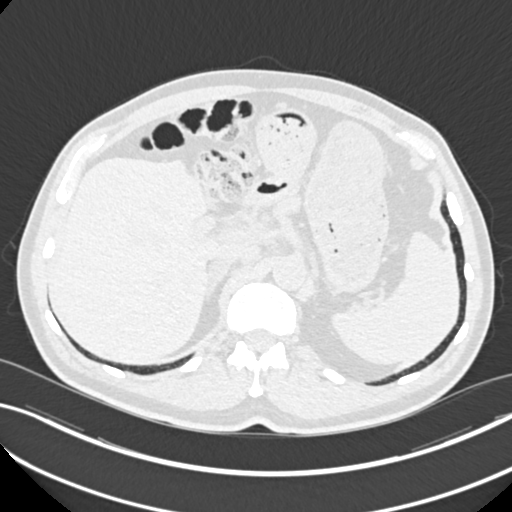
[im 39/168  lung]
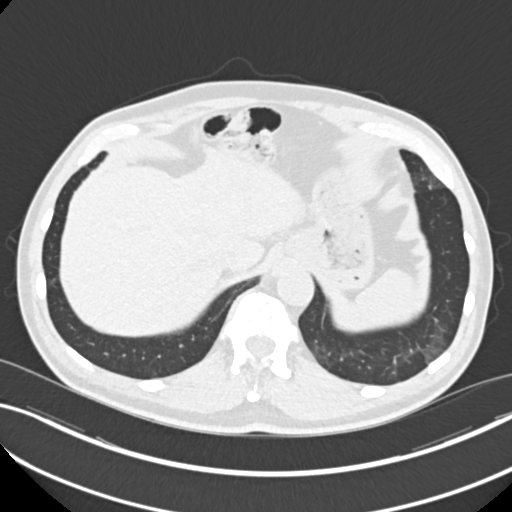
[im 52/168  lung]
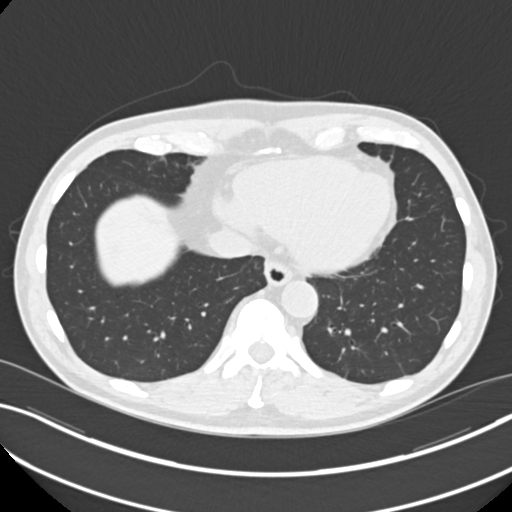
[im 65/168  mediastinal]
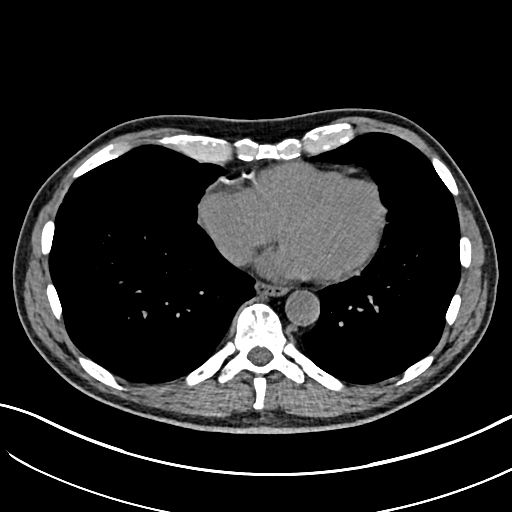
[im 65/168  lung]
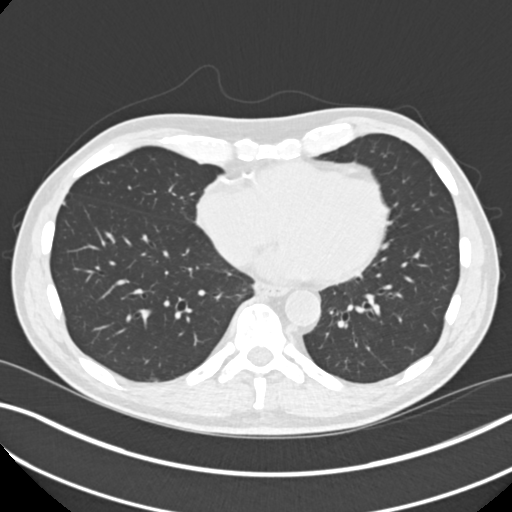
[im 78/168  lung]
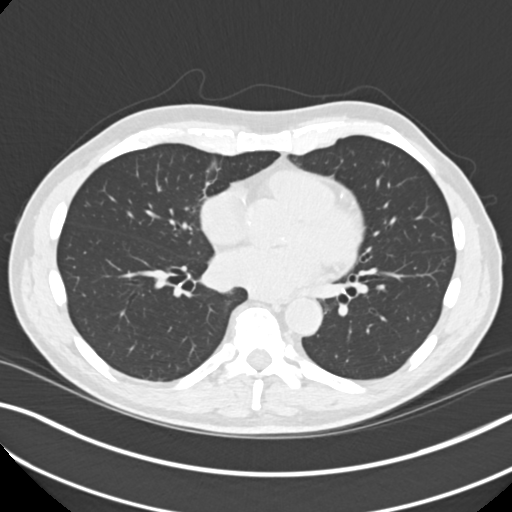
[im 90/168  lung]
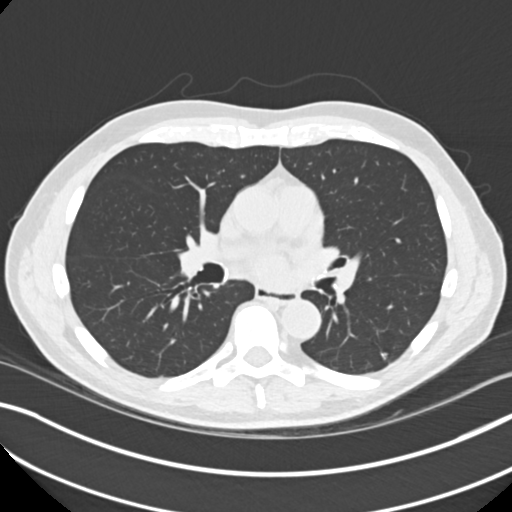
[im 103/168  lung]
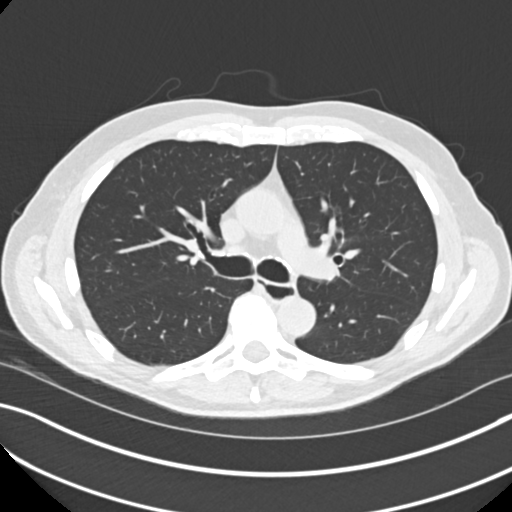
[im 116/168  mediastinal]
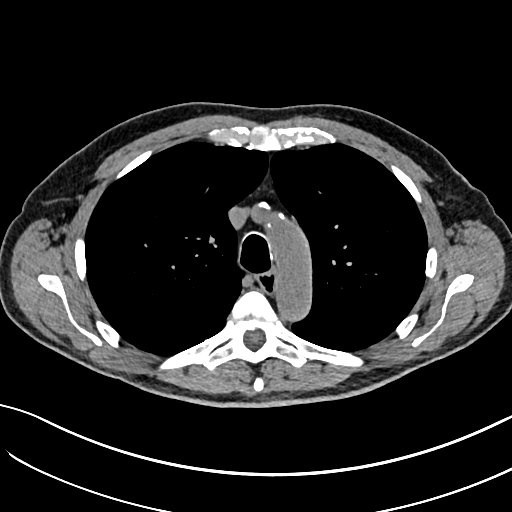
[im 116/168  lung]
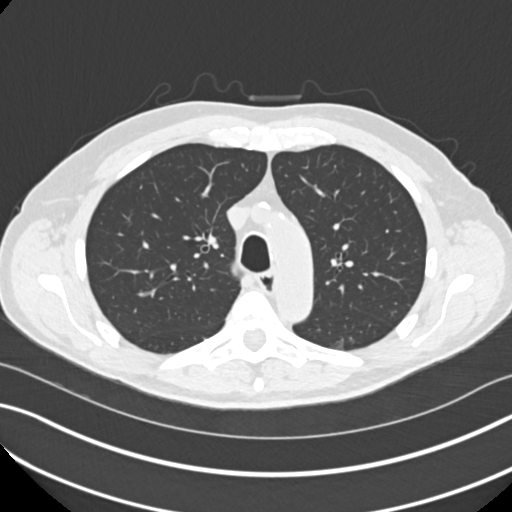
[im 129/168  lung]
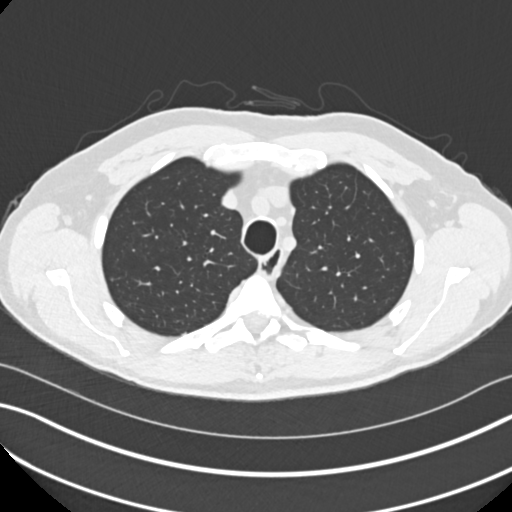
[im 142/168  lung]
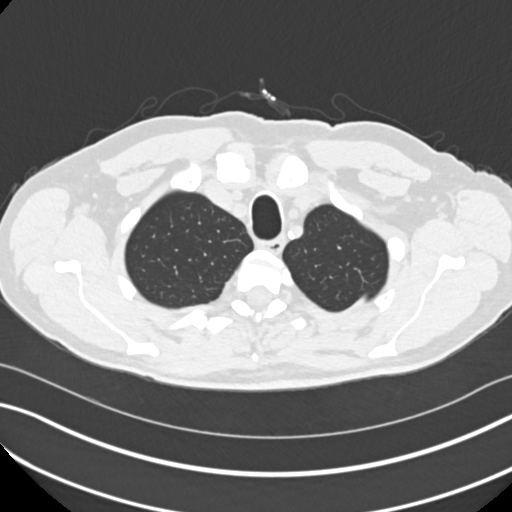
[im 155/168  lung]
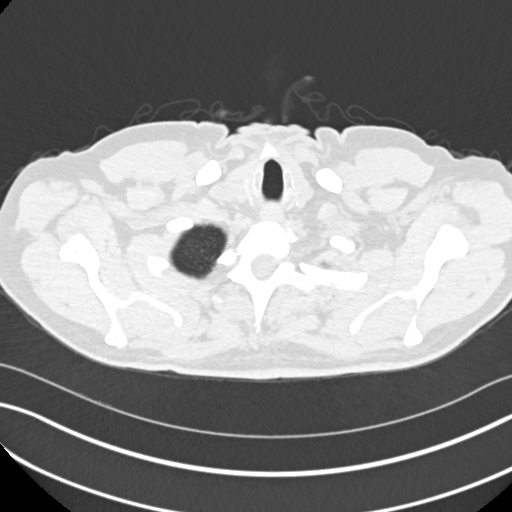

[Series 4: chest 2.00 br40 s3 · coronal · 0.66mm/px · 3 of 183 slices shown (2 of 2)]
[im 37/183  lung]
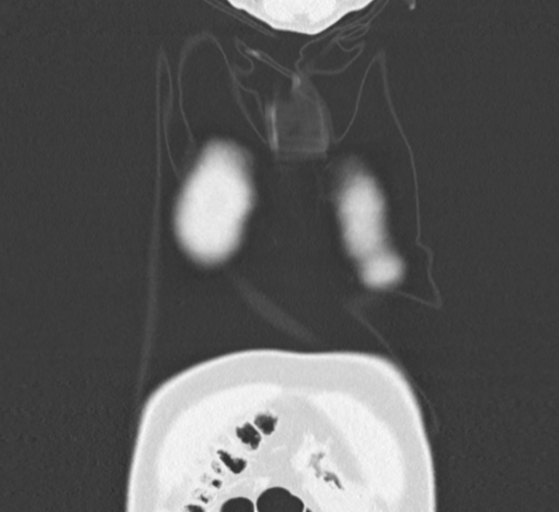
[im 73/183  lung]
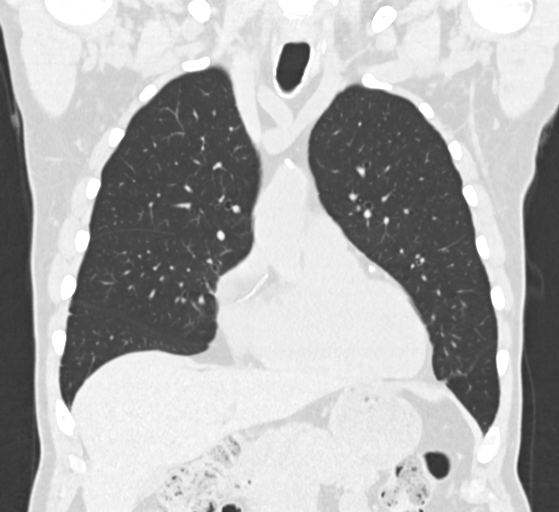
[im 110/183  lung]
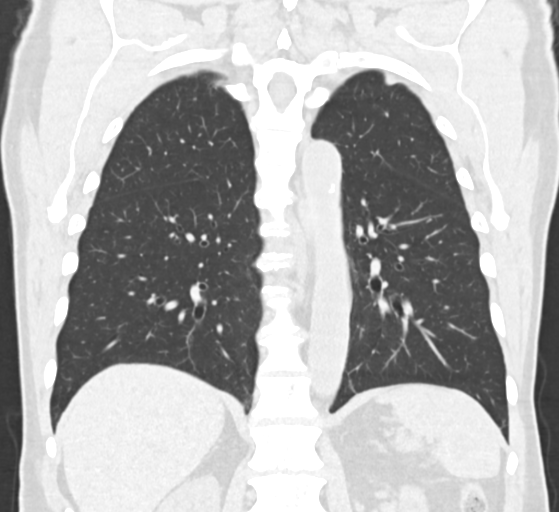

[15 of 36 positions shown; findings below may reference images not displayed]

FINDINGS: Cardiovascular: Aortic atherosclerosis. Normal heart size.
Three-vessel coronary artery calcifications and/or stents. No
pericardial effusion.

Mediastinum/Nodes: No enlarged mediastinal, hilar, or axillary lymph
nodes. Thyroid gland, trachea, and esophagus demonstrate no
significant findings.

Lungs/Pleura: There are occasional stable small bilateral pulmonary
nodules, for example a 4 mm nodule in the medial left lower lobe
(series 8, image 99) and a 4 mm nodule in the dependent right lower
lobe (series 8, image 87). No pleural effusion or pneumothorax.

Upper Abdomen: No acute abnormality.

Musculoskeletal: No chest wall mass or suspicious bone lesions
identified.
IMPRESSION: 1. Occasional stable small pulmonary nodules measuring 4 mm or
smaller, definitively benign. No further CT follow-up is required
for these nodules.
2. Coronary artery disease.  Aortic Atherosclerosis ([id number]).

## 2022-03-22 DIAGNOSIS — D3131 Benign neoplasm of right choroid: Secondary | ICD-10-CM | POA: Diagnosis not present

## 2022-03-29 ENCOUNTER — Encounter: Payer: Self-pay | Admitting: Family Medicine

## 2022-03-29 ENCOUNTER — Ambulatory Visit (INDEPENDENT_AMBULATORY_CARE_PROVIDER_SITE_OTHER): Payer: Medicare Other | Admitting: Family Medicine

## 2022-03-29 VITALS — BP 120/78 | HR 62 | Temp 97.8°F | Ht 64.0 in | Wt 157.2 lb

## 2022-03-29 DIAGNOSIS — E039 Hypothyroidism, unspecified: Secondary | ICD-10-CM | POA: Diagnosis not present

## 2022-03-29 DIAGNOSIS — N401 Enlarged prostate with lower urinary tract symptoms: Secondary | ICD-10-CM

## 2022-03-29 DIAGNOSIS — K219 Gastro-esophageal reflux disease without esophagitis: Secondary | ICD-10-CM | POA: Diagnosis not present

## 2022-03-29 DIAGNOSIS — I10 Essential (primary) hypertension: Secondary | ICD-10-CM

## 2022-03-29 DIAGNOSIS — N138 Other obstructive and reflux uropathy: Secondary | ICD-10-CM | POA: Diagnosis not present

## 2022-03-29 DIAGNOSIS — R739 Hyperglycemia, unspecified: Secondary | ICD-10-CM | POA: Diagnosis not present

## 2022-03-29 DIAGNOSIS — Z23 Encounter for immunization: Secondary | ICD-10-CM | POA: Diagnosis not present

## 2022-03-29 DIAGNOSIS — R972 Elevated prostate specific antigen [PSA]: Secondary | ICD-10-CM | POA: Diagnosis not present

## 2022-03-29 DIAGNOSIS — M159 Polyosteoarthritis, unspecified: Secondary | ICD-10-CM

## 2022-03-29 DIAGNOSIS — E782 Mixed hyperlipidemia: Secondary | ICD-10-CM | POA: Diagnosis not present

## 2022-03-29 LAB — BASIC METABOLIC PANEL
BUN: 18 mg/dL (ref 6–23)
CO2: 29 mEq/L (ref 19–32)
Calcium: 9.6 mg/dL (ref 8.4–10.5)
Chloride: 101 mEq/L (ref 96–112)
Creatinine, Ser: 1.15 mg/dL (ref 0.40–1.50)
GFR: 60.74 mL/min (ref >60.00)
Glucose, Bld: 94 mg/dL (ref 70–99)
Potassium: 4 mEq/L (ref 3.5–5.1)
Sodium: 135 mEq/L (ref 135–145)

## 2022-03-29 LAB — TSH: TSH: 2.56 u[IU]/mL (ref 0.35–5.50)

## 2022-03-29 LAB — HEPATIC FUNCTION PANEL
ALT: 20 U/L (ref 0–53)
AST: 23 U/L (ref 0–37)
Albumin: 4.3 g/dL (ref 3.5–5.2)
Alkaline Phosphatase: 50 U/L (ref 39–117)
Bilirubin, Direct: 0.1 mg/dL (ref 0.0–0.3)
Total Bilirubin: 0.8 mg/dL (ref 0.2–1.2)
Total Protein: 6.7 g/dL (ref 6.0–8.3)

## 2022-03-29 LAB — LIPID PANEL
Cholesterol: 135 mg/dL (ref 0–200)
HDL: 51.3 mg/dL (ref >39.00)
LDL Cholesterol: 69 mg/dL (ref 0–99)
NonHDL: 83.34
Total CHOL/HDL Ratio: 3
Triglycerides: 73 mg/dL (ref 0.0–149.0)
VLDL: 14.6 mg/dL (ref 0.0–40.0)

## 2022-03-29 LAB — CBC WITH DIFFERENTIAL/PLATELET
Basophils Absolute: 0 10*3/uL (ref 0.0–0.1)
Basophils Relative: 0.8 % (ref 0.0–3.0)
Eosinophils Absolute: 0.1 10*3/uL (ref 0.0–0.7)
Eosinophils Relative: 2.6 % (ref 0.0–5.0)
HCT: 41.4 % (ref 39.0–52.0)
Hemoglobin: 14.6 g/dL (ref 13.0–17.0)
Lymphocytes Relative: 30.7 % (ref 12.0–46.0)
Lymphs Abs: 1.6 10*3/uL (ref 0.7–4.0)
MCHC: 35.3 g/dL (ref 30.0–36.0)
MCV: 90.6 fl (ref 78.0–100.0)
Monocytes Absolute: 0.5 10*3/uL (ref 0.1–1.0)
Monocytes Relative: 9.2 % (ref 3.0–12.0)
Neutro Abs: 2.9 10*3/uL (ref 1.4–7.7)
Neutrophils Relative %: 56.7 % (ref 43.0–77.0)
Platelets: 204 10*3/uL (ref 150.0–400.0)
RBC: 4.57 Mil/uL (ref 4.22–5.81)
RDW: 13.1 % (ref 11.5–15.5)
WBC: 5.1 10*3/uL (ref 4.0–10.5)

## 2022-03-29 LAB — T3, FREE: T3, Free: 2.8 pg/mL (ref 2.3–4.2)

## 2022-03-29 LAB — T4, FREE: Free T4: 1.03 ng/dL (ref 0.60–1.60)

## 2022-03-29 LAB — HEMOGLOBIN A1C: Hgb A1c MFr Bld: 5.5 % (ref 4.6–6.5)

## 2022-03-29 MED ORDER — LOSARTAN POTASSIUM 100 MG PO TABS: 100.0000 mg | ORAL_TABLET | Freq: Every day | ORAL | 3 refills | Status: DC

## 2022-03-29 MED ORDER — HYDROCHLOROTHIAZIDE 25 MG PO TABS: 25.0000 mg | ORAL_TABLET | Freq: Every day | ORAL | 3 refills | Status: DC

## 2022-03-29 MED ORDER — ROSUVASTATIN CALCIUM 10 MG PO TABS: 10.0000 mg | ORAL_TABLET | Freq: Every day | ORAL | 3 refills | Status: DC

## 2022-03-29 MED ORDER — POTASSIUM CHLORIDE ER 10 MEQ PO TBCR: 10.0000 meq | EXTENDED_RELEASE_TABLET | Freq: Every day | ORAL | 3 refills | Status: DC

## 2022-03-29 MED ORDER — LEVOTHYROXINE SODIUM 75 MCG PO TABS: 75.0000 ug | ORAL_TABLET | Freq: Every day | ORAL | 3 refills | Status: DC

## 2022-03-29 NOTE — Progress Notes (Signed)
Subjective:    Patient ID: Mason [name], male    DOB: 09-08-1940, 81 y.o.   MRN: [id number]  HPI Here to follow up on issues. He feels well in general . He sees Dr. Derry Skill, a urologist in [facility], twice a year for his BPH and elevated PSA. Last year she started him on Proscar, and the Psa has come down from 12.6 to 6.5. he had an MRI of the prostate last summer and this was benign. He urinate fairly freely and he seldom gets up to urinate at night. His BP is stable. His GERD is stable. His OA is stable.   Review of Systems  Constitutional: Negative.   HENT: Negative.    Eyes: Negative.   Respiratory: Negative.    Cardiovascular: Negative.   Gastrointestinal: Negative.   Genitourinary: Negative.   Musculoskeletal: Negative.   Skin: Negative.   Neurological: Negative.   Psychiatric/Behavioral: Negative.         Objective:   Physical Exam Constitutional:      General: He is not in acute distress.    Appearance: Normal appearance. He is well-developed. He is not diaphoretic.  HENT:     Head: Normocephalic and atraumatic.     Right Ear: External ear normal.     Left Ear: External ear normal.     Nose: Nose normal.     Mouth/Throat:     Pharynx: No oropharyngeal exudate.  Eyes:     General: No scleral icterus.       Right eye: No discharge.        Left eye: No discharge.     Conjunctiva/sclera: Conjunctivae normal.     Pupils: Pupils are equal, round, and reactive to light.  Neck:     Thyroid: No thyromegaly.     Vascular: No JVD.     Trachea: No tracheal deviation.  Cardiovascular:     Rate and Rhythm: Normal rate and regular rhythm.     Heart sounds: Normal heart sounds. No murmur heard.    No friction rub. No gallop.  Pulmonary:     Effort: Pulmonary effort is normal. No respiratory distress.     Breath sounds: Normal breath sounds. No wheezing or rales.  Chest:     Chest wall: No tenderness.  Abdominal:     General: Bowel sounds are normal.  There is no distension.     Palpations: Abdomen is soft. There is no mass.     Tenderness: There is no abdominal tenderness. There is no guarding or rebound.  Genitourinary:    Penis: No tenderness.   Musculoskeletal:        General: No tenderness. Normal range of motion.     Cervical back: Neck supple.  Lymphadenopathy:     Cervical: No cervical adenopathy.  Skin:    General: Skin is warm and dry.     Coloration: Skin is not pale.     Findings: No erythema or rash.  Neurological:     Mental Status: He is alert and oriented to person, place, and time.     Cranial Nerves: No cranial nerve deficit.     Motor: No abnormal muscle tone.     Coordination: Coordination normal.     Deep Tendon Reflexes: Reflexes are normal and symmetric. Reflexes normal.  Psychiatric:        Behavior: Behavior normal.        Thought Content: Thought content normal.        Judgment: Judgment  normal.           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 He will follow up with Dr. Araceli Bouche for the prostate issues. Get fasting labs to check lipids, a thyroid panel, etc. He will meet with Dr. Henrene Pastor on 04-12-22 to discuss whether on not to do one more colonoscopy. We spent a total of ( 33  ) minutes reviewing records and discussing these issues.  Alysia Penna, MD   .Alferd Apa

## 2022-03-29 NOTE — Addendum Note (Signed)
Addended by: Wyvonne Lenz on: 03/29/2022 09:16 AM   Modules accepted: Orders

## 2022-04-12 ENCOUNTER — Ambulatory Visit: Payer: Medicare Other | Admitting: Internal Medicine

## 2022-04-12 ENCOUNTER — Ambulatory Visit (INDEPENDENT_AMBULATORY_CARE_PROVIDER_SITE_OTHER): Payer: Medicare Other | Admitting: Internal Medicine

## 2022-04-12 ENCOUNTER — Encounter: Payer: Self-pay | Admitting: Internal Medicine

## 2022-04-12 VITALS — BP 112/68 | HR 66 | Ht 64.0 in | Wt 159.0 lb

## 2022-04-12 DIAGNOSIS — K219 Gastro-esophageal reflux disease without esophagitis: Secondary | ICD-10-CM

## 2022-04-12 DIAGNOSIS — Z86010 Personal history of colonic polyps: Secondary | ICD-10-CM | POA: Diagnosis not present

## 2022-04-12 MED ORDER — NA SULFATE-K SULFATE-MG SULF 17.5-3.13-1.6 GM/177ML PO SOLN: 1.0000 | Freq: Once | ORAL | 0 refills | Status: AC

## 2022-04-12 NOTE — Progress Notes (Signed)
HISTORY OF PRESENT ILLNESS:  Mason [name] is a 81 y.o. male, avid golfer and resident at the [facility], who presents today regarding the need for surveillance colonoscopy.  Patient has a history of colon cancer in his brother who died in his 63s.  He also has a personal history of multiple adenomatous colon polyps with prior colonoscopy 2002, 2004, 2008, 2013, and 2018.  On his most recent examination he was found to have a tubular adenoma in addition to diverticulosis.  Follow-up in 5 years recommended.  Patient tells me that he has enjoyed good health.  His GI review of systems is negative except for acid reflux.  On no medication.  He tells me that he has enjoyed good health since his last colonoscopy.  He has longevity in his family.  He remains quite active.  He is motivated for colonoscopy.  Review of blood work from March 29, 2022 shows normal comprehensive metabolic panel and normal CBC with hemoglobin 14.6.  REVIEW OF SYSTEMS:  All non-GI ROS negative unless otherwise stated in the HPI.    Past Medical History:  Diagnosis Date   Benign prostatic hypertrophy with elevated PSA    sees Dr. Derry Skill in [facility]   GERD (gastroesophageal reflux disease)    Hyperlipidemia    Hypertension    Normal cardiac stress test 09/04/2012   Osteoporosis    Thyroid disease    hypothyroidism    Past Surgical History:  Procedure Laterality Date   BASAL CELL CARCINOMA EXCISION  2017   off nose, per Dr. Leilani Merl    COLONOSCOPY  09/18/2016   per Dr. Henrene Pastor, adenomatous polyp, repeat in 5 yrs   PROSTATE BIOPSY     x6   TONSILLECTOMY     WISDOM TOOTH EXTRACTION      Social History Mason [name]  reports that he quit smoking about 53 years ago. His smoking use included cigarettes. He has never used smokeless tobacco. He reports that he does not drink alcohol and does not use drugs.  family history includes Aneurysm in an other family member; Cancer in an other family member; Colon  cancer in his brother and another family member; Coronary artery disease in an other family member; Heart disease in his father and mother.  No Known Allergies     PHYSICAL EXAMINATION: Vital signs: BP 112/68   Pulse 66   Ht '5\' 4"'$  (1.626 m)   Wt 159 lb (72.1 kg)   BMI 27.29 kg/m   Constitutional: generally well-appearing, no acute distress Psychiatric: alert and oriented x3, cooperative Eyes: extraocular movements intact, anicteric, conjunctiva pink Mouth: oral pharynx moist, no lesions Neck: supple no lymphadenopathy Cardiovascular: heart regular rate and rhythm, no murmur Lungs: clear to auscultation bilaterally Abdomen: soft, nontender, nondistended, no obvious ascites, no peritoneal signs, normal bowel sounds, no organomegaly Rectal: Deferred to colonoscopy Extremities: no clubbing, cyanosis, or lower extremity edema bilaterally Skin: no lesions on visible extremities Neuro: No focal deficits.  Cranial nerves intact.   ASSESSMENT:  1.  Personal history of multiple adenomatous colon polyps.  Due for surveillance.  Appropriate candidate without contraindication. 2.  Family history of colon cancer in brother 65.  GERD.  No alarm features   PLAN:  1.  Surveillance colonoscopy.The nature of the procedure, as well as the risks, benefits, and alternatives were carefully and thoroughly reviewed with the patient. Ample time for discussion and questions allowed. The patient understood, was satisfied, and agreed to proceed.  2.  Reflux precautions

## 2022-04-12 NOTE — Patient Instructions (Signed)
_______________________________________________________  If you are age 79 or older, your body mass index should be between 23-30. Your Body mass index is 27.29 kg/m. If this is out of the aforementioned range listed, please consider follow up with your Primary Care Provider.  If you are age 59 or younger, your body mass index should be between 19-25. Your Body mass index is 27.29 kg/m. If this is out of the aformentioned range listed, please consider follow up with your Primary Care Provider.   ________________________________________________________  [facility] providers would like to encourage you to use [facility] to communicate with providers for non-urgent requests or questions.  Due to long hold times on the telephone, sending your provider a message by [facility] may be a faster and more efficient way to get a response.  Please allow 48 business hours for a response.  Please remember that this is for non-urgent requests.  _______________________________________________________  Mason [name] have been scheduled for a colonoscopy. Please follow written instructions given to you at your visit today.  Please pick up your prep supplies at the pharmacy within the next 1-3 days. If you use inhalers (even only as needed), please bring them with you on the day of your procedure.

## 2022-04-13 ENCOUNTER — Encounter: Payer: Self-pay | Admitting: Internal Medicine

## 2022-04-25 ENCOUNTER — Encounter: Payer: Medicare Other | Admitting: Internal Medicine

## 2022-05-07 ENCOUNTER — Encounter: Payer: Self-pay | Admitting: Internal Medicine

## 2022-05-07 ENCOUNTER — Ambulatory Visit (AMBULATORY_SURGERY_CENTER): Payer: Medicare Other | Admitting: Internal Medicine

## 2022-05-07 VITALS — BP 126/70 | HR 47 | Temp 97.8°F | Resp 14 | Ht 64.0 in | Wt 159.0 lb

## 2022-05-07 DIAGNOSIS — Z09 Encounter for follow-up examination after completed treatment for conditions other than malignant neoplasm: Secondary | ICD-10-CM | POA: Diagnosis not present

## 2022-05-07 DIAGNOSIS — Z800 Family history of malignant neoplasm of digestive organs: Secondary | ICD-10-CM | POA: Diagnosis not present

## 2022-05-07 DIAGNOSIS — Z86010 Personal history of colonic polyps: Secondary | ICD-10-CM | POA: Diagnosis not present

## 2022-05-07 DIAGNOSIS — I10 Essential (primary) hypertension: Secondary | ICD-10-CM | POA: Diagnosis not present

## 2022-05-07 DIAGNOSIS — E039 Hypothyroidism, unspecified: Secondary | ICD-10-CM | POA: Diagnosis not present

## 2022-05-07 DIAGNOSIS — D128 Benign neoplasm of rectum: Secondary | ICD-10-CM | POA: Diagnosis not present

## 2022-05-07 HISTORY — PX: COLONOSCOPY: SHX174

## 2022-05-07 MED ORDER — SODIUM CHLORIDE 0.9 % IV SOLN: 500.0000 mL | Freq: Once | INTRAVENOUS | Status: DC

## 2022-05-07 NOTE — Progress Notes (Signed)
VS completed by DT.  Pt's states no medical or surgical changes since previsit or office visit.  

## 2022-05-07 NOTE — Op Note (Signed)
[facility] Patient Name: Mason [name] Procedure Date: 05/07/2022 4:17 PM MRN: [id number] Endoscopist: Docia Chuck. Henrene Pastor , MD, [id number] Age: 81 Referring MD:  Date of Birth: 08-15-1940 Gender: Male Account #: [id number] Procedure:                Colonoscopy with cold snare polypectomy x 1 Indications:              High risk colon cancer surveillance: Personal                            history of multiple (3 or more) adenomas. Brother                            with history of colon cancer. Previous examinations                            2002, 2004, 2008, 2013, 2018 Medicines:                Monitored Anesthesia Care Procedure:                Pre-Anesthesia Assessment:                           - Prior to the procedure, a History and Physical                            was performed, and patient medications and                            allergies were reviewed. The patient's tolerance of                            previous anesthesia was also reviewed. The risks                            and benefits of the procedure and the sedation                            options and risks were discussed with the patient.                            All questions were answered, and informed consent                            was obtained. Prior Anticoagulants: The patient has                            taken no anticoagulant or antiplatelet agents. ASA                            Grade Assessment: II - A patient with mild systemic                            disease. After reviewing the risks and benefits,  the patient was deemed in satisfactory condition to                            undergo the procedure.                           After obtaining informed consent, the colonoscope                            was passed under direct vision. Throughout the                            procedure, the patient's blood pressure, pulse, and                             oxygen saturations were monitored continuously. The                            Olympus CF-HQ190L ([id number]) Colonoscope was                            introduced through the anus and advanced to the the                            cecum, identified by appendiceal orifice and                            ileocecal valve. The ileocecal valve, appendiceal                            orifice, and rectum were photographed. The quality                            of the bowel preparation was excellent. The                            colonoscopy was performed without difficulty. The                            patient tolerated the procedure well. The bowel                            preparation used was SUPREP via split dose                            instruction. Scope In: 4:37:31 PM Scope Out: 4:55:05 PM Scope Withdrawal Time: 0 hours 11 minutes 59 seconds  Total Procedure Duration: 0 hours 17 minutes 34 seconds  Findings:                 A 4 mm polyp was found in the rectum. The polyp was                            removed with a cold snare. Resection and retrieval  were complete.                           A few diverticula were found in the sigmoid colon.                           Internal hemorrhoids were found during                            retroflexion. The hemorrhoids were small.                           The exam was otherwise without abnormality on                            direct and retroflexion views. Complications:            No immediate complications. Estimated blood loss:                            None. Estimated Blood Loss:     Estimated blood loss: none. Impression:               - One 4 mm polyp in the rectum, removed with a cold                            snare. Resected and retrieved.                           - Diverticulosis in the sigmoid colon.                           - Internal hemorrhoids.                           - The examination was  otherwise normal on direct                            and retroflexion views. Recommendation:           - Repeat colonoscopy is not recommended for                            surveillance.                           - Patient has a contact number available for                            emergencies. The signs and symptoms of potential                            delayed complications were discussed with the                            patient. Return to normal activities tomorrow.  Written discharge instructions were provided to the                            patient.                           - Resume previous diet.                           - Continue present medications.                           - Await pathology results. Docia Chuck. Henrene Pastor, MD 05/07/2022 5:03:06 PM This report has been signed electronically.

## 2022-05-07 NOTE — Progress Notes (Signed)
Mason [name] is a 81 y.o. male, avid golfer and resident at the [facility], who presents today regarding the need for surveillance colonoscopy.  Patient has a history of colon cancer in his brother who died in his 45s.  He also has a personal history of multiple adenomatous colon polyps with prior colonoscopy 2002, 2004, 2008, 2013, and 2018.  On his most recent examination he was found to have a tubular adenoma in addition to diverticulosis.  Follow-up in 5 years recommended.   Patient tells me that he has enjoyed good health.  His GI review of systems is negative except for acid reflux.  On no medication.  He tells me that he has enjoyed good health since his last colonoscopy.  He has longevity in his family.  He remains quite active.  He is motivated for colonoscopy.  Review of blood work from March 29, 2022 shows normal comprehensive metabolic panel and normal CBC with hemoglobin 14.6.   REVIEW OF SYSTEMS:   All non-GI ROS negative unless otherwise stated in the HPI.         Past Medical History:  Diagnosis Date   Benign prostatic hypertrophy with elevated PSA      sees Dr. Derry Skill in [facility]   GERD (gastroesophageal reflux disease)     Hyperlipidemia     Hypertension     Normal cardiac stress test 09/04/2012   Osteoporosis     Thyroid disease      hypothyroidism           Past Surgical History:  Procedure Laterality Date   BASAL CELL CARCINOMA EXCISION   2017    off nose, per Dr. Leilani Merl    COLONOSCOPY   09/18/2016    per Dr. Henrene Pastor, adenomatous polyp, repeat in 5 yrs   PROSTATE BIOPSY        x6   TONSILLECTOMY       WISDOM TOOTH EXTRACTION          Social History Mason [name]  reports that he quit smoking about 53 years ago. His smoking use included cigarettes. He has never used smokeless tobacco. He reports that he does not drink alcohol and does not use drugs.   family history includes Aneurysm in an other family member; Cancer in an other family member;  Colon cancer in his brother and another family member; Coronary artery disease in an other family member; Heart disease in his father and mother.   No Known Allergies       PHYSICAL EXAMINATION: Vital signs: BP 112/68   Pulse 66   Ht '5\' 4"'$  (1.626 m)   Wt 159 lb (72.1 kg)   BMI 27.29 kg/m   Constitutional: generally well-appearing, no acute distress Psychiatric: alert and oriented x3, cooperative Eyes: extraocular movements intact, anicteric, conjunctiva pink Mouth: oral pharynx moist, no lesions Neck: supple no lymphadenopathy Cardiovascular: heart regular rate and rhythm, no murmur Lungs: clear to auscultation bilaterally Abdomen: soft, nontender, nondistended, no obvious ascites, no peritoneal signs, normal bowel sounds, no organomegaly Rectal: Deferred to colonoscopy Extremities: no clubbing, cyanosis, or lower extremity edema bilaterally Skin: no lesions on visible extremities Neuro: No focal deficits.  Cranial nerves intact.    ASSESSMENT:   1.  Personal history of multiple adenomatous colon polyps.  Due for surveillance.  Appropriate candidate without contraindication. 2.  Family history of colon cancer in brother 30.  GERD.  No alarm features     PLAN:   1.  Surveillance colonoscopy.The nature  of the procedure, as well as the risks, benefits, and alternatives were carefully and thoroughly reviewed with the patient. Ample time for discussion and questions allowed. The patient understood, was satisfied, and agreed to proceed.  2.  Reflux precautions

## 2022-05-07 NOTE — Progress Notes (Signed)
Sedate, gd SR, tolerated procedure well, VSS, report to RN 

## 2022-05-07 NOTE — Patient Instructions (Signed)
    Handouts on polyps,diverticulosis ,& hemorrhoids given to you today  Await pathology results on polyp removed   Resume usual diet and medications   YOU HAD AN ENDOSCOPIC PROCEDURE TODAY AT [facility]:   Refer to the procedure report that was given to you for any specific questions about what was found during the examination.  If the procedure report does not answer your questions, please call your gastroenterologist to clarify.  If you requested that your care partner not be given the details of your procedure findings, then the procedure report has been included in a sealed envelope for you to review at your convenience later.  YOU SHOULD EXPECT: Some feelings of bloating in the abdomen. Passage of more gas than usual.  Walking can help get rid of the air that was put into your GI tract during the procedure and reduce the bloating. If you had a lower endoscopy (such as a colonoscopy or flexible sigmoidoscopy) you may notice spotting of blood in your stool or on the toilet paper. If you underwent a bowel prep for your procedure, you may not have a normal bowel movement for a few days.  Please Note:  You might notice some irritation and congestion in your nose or some drainage.  This is from the oxygen used during your procedure.  There is no need for concern and it should clear up in a day or so.  SYMPTOMS TO REPORT IMMEDIATELY:  Following lower endoscopy (colonoscopy or flexible sigmoidoscopy):  Excessive amounts of blood in the stool  Significant tenderness or worsening of abdominal pains  Swelling of the abdomen that is new, acute  Fever of 100F or higher    For urgent or emergent issues, a gastroenterologist can be reached at any hour by calling [phone]. Do not use MyChart messaging for urgent concerns.    DIET:  We do recommend a small meal at first, but then you may proceed to your regular diet.  Drink plenty of fluids but you should avoid alcoholic  beverages for 24 hours.  ACTIVITY:  You should plan to take it easy for the rest of today and you should NOT DRIVE or use heavy machinery until tomorrow (because of the sedation medicines used during the test).    FOLLOW UP: Our staff will call the number listed on your records the next business day following your procedure.  We will call around 7:15- 8:00 am to check on you and address any questions or concerns that you may have regarding the information given to you following your procedure. If we do not reach you, we will leave a message.     If any biopsies were taken you will be contacted by phone or by letter within the next 1-3 weeks.  Please call us at [phone] if you have not heard about the biopsies in 3 weeks.    SIGNATURES/CONFIDENTIALITY: You and/or your care partner have signed paperwork which will be entered into your electronic medical record.  These signatures attest to the fact that that the information above on your After Visit Summary has been reviewed and is understood.  Full responsibility of the confidentiality of this discharge information lies with you and/or your care-partner.

## 2022-05-08 ENCOUNTER — Telehealth: Payer: Self-pay

## 2022-05-08 NOTE — Telephone Encounter (Signed)
No answer, left message to call if having any issues or concerns, B.Onyx Schirmer RN 

## 2022-05-10 ENCOUNTER — Encounter: Payer: Self-pay | Admitting: Internal Medicine

## 2022-05-15 ENCOUNTER — Telehealth (INDEPENDENT_AMBULATORY_CARE_PROVIDER_SITE_OTHER): Payer: Medicare Other

## 2022-05-15 ENCOUNTER — Ambulatory Visit: Payer: Medicare Other

## 2022-05-15 VITALS — Ht 64.0 in | Wt 159.0 lb

## 2022-05-15 DIAGNOSIS — Z0000 Encounter for general adult medical examination without abnormal findings: Secondary | ICD-10-CM | POA: Diagnosis not present

## 2022-05-15 NOTE — Progress Notes (Signed)
Subjective:   Mason [name] is a 81 y.o. male who presents for Medicare Annual/Subsequent preventive examination.  Review of Systems    Virtual Visit via Telephone Note  I connected with  [name] on 05/15/22 at 12:30 PM EST by telephone and verified that I am speaking with the correct person using two identifiers.  Location: Patient: Home Provider: Office Persons participating in the virtual visit: patient/Nurse Health Advisor   I discussed the limitations, risks, security and privacy concerns of performing an evaluation and management service by telephone and the availability of in person appointments. The patient expressed understanding and agreed to proceed.  Interactive audio and video telecommunications were attempted between this nurse and patient, however failed, due to patient having technical difficulties OR patient did not have access to video capability.  We continued and completed visit with audio only.  Some vital signs may be absent or patient reported.   Criselda Peaches, LPN  Cardiac Risk Factors include: advanced age (>96mn, >>39women);hypertension;male gender     Objective:    Today's Vitals   05/15/22 1233  Weight: 159 lb (72.1 kg)  Height: '5\' 4"'$  (1.626 m)   Body mass index is 27.29 kg/m.     05/15/2022   12:41 PM 05/09/2021   12:22 PM 03/31/2020    9:05 AM 08/21/2018    8:26 AM 03/06/2017   10:18 AM 09/18/2016   10:46 AM 09/04/2016   10:22 AM  Advanced Directives  Does Patient Have a Medical Advance Directive? Yes Yes Yes Yes Yes Yes Yes  Type of AParamedicof [place]Living will H[place]Living will H[place]Living will HFort Oglethor[place]Living will   H[place]Living will  Does patient want to make changes to medical advance directive?   No - Patient declined      Copy of HTierra Amarillain Chart? No - copy requested No - copy  requested  No - copy requested   No - copy requested    Current Medications (verified) Outpatient Encounter Medications as of 05/15/2022  Medication Sig   finasteride (PROSCAR) 5 MG tablet    hydrochlorothiazide (HYDRODIURIL) 25 MG tablet Take 1 tablet (25 mg total) by mouth daily.   levothyroxine (SYNTHROID) 75 MCG tablet Take 1 tablet (75 mcg total) by mouth daily.   losartan (COZAAR) 100 MG tablet Take 1 tablet (100 mg total) by mouth daily.   potassium chloride (KLOR-CON 10) 10 MEQ tablet Take 1 tablet (10 mEq total) by mouth daily.   rosuvastatin (CRESTOR) 10 MG tablet Take 1 tablet (10 mg total) by mouth daily.   No facility-administered encounter medications on file as of 05/15/2022.    Allergies (verified) Patient has no known allergies.   History: Past Medical History:  Diagnosis Date   Benign prostatic hypertrophy with elevated PSA    sees Dr. [facility]erry Skillin [facility] [place]  GERD (gastroesophageal reflux disease)    Hyperlipidemia    Hypertension    Normal cardiac stress test 09/04/2012   Osteoporosis    Thyroid disease    hypothyroidism   Past Surgical History:  Procedure Laterality Date   BASAL CELL CARCINOMA EXCISION  2017   off nose, per Dr. MLeilani Merl   COLONOSCOPY  09/18/2016   per Dr. PHenrene Pastor adenomatous polyp, repeat in 5 yrs   PROSTATE BIOPSY     x6   TONSILLECTOMY     WISDOM TOOTH EXTRACTION  Family History  Problem Relation Age of Onset   Heart disease Mother    Heart disease Father    Colon cancer Brother        late 10's   Coronary artery disease Other        family hx   Aneurysm Other        family hx   Cancer Other        colon   Colon cancer Other        family hx   Esophageal cancer Neg Hx    Stomach cancer Neg Hx    Social History   Socioeconomic History   Marital status: Married    Spouse name: Not on file   Number of children: 2   Years of education: Not on file   Highest education level: Not on file  Occupational History    Occupation: Engineering/business    Comment: Therapist, art, retired  Tobacco Use   Smoking status: Former    Types: Cigarettes    Quit date: 06/25/1968    Years since quitting: 53.9   Smokeless tobacco: Never   Tobacco comments:    not clear but did discuss AAA  Vaping Use   Vaping Use: Never used  Substance and Sexual Activity   Alcohol use: No    Alcohol/week: 0.0 standard drinks of alcohol   Drug use: No   Sexual activity: Not on file  Other Topics Concern   Not on file  Social History Narrative   Lives with wife on one level   Has daughter in [place], one daughter in [place] with four grandchildren   Enjoys travelling with wife; goes to house in [place] frequently   Attends bible study    Exercises at gym and/or outside routinely, doing yoga, biking   Social Determinants of Health   Financial Resource Strain: [place]  (05/15/2022)   Overall Financial Resource Strain (CARDIA)    Difficulty of Paying Living Expenses: Not hard at all  Food Insecurity: No Food Insecurity (05/15/2022)   Hunger Vital Sign    Worried About Running Out of Food in the Last Year: Never true    [place] in the Last Year: Never true  Transportation Needs: No Transportation Needs (05/15/2022)   PRAPARE - Hydrologist (Medical): No    Lack of Transportation (Non-Medical): No  Physical Activity: Sufficiently Active (05/15/2022)   Exercise Vital Sign    Days of Exercise per Week: 3 days    Minutes of Exercise per Session: 60 min  Stress: No Stress Concern Present (05/15/2022)   [place]    Feeling of Stress : Not at all  Social Connections: [place] (05/15/2022)   Social Connection and Isolation Panel [NHANES]    Frequency of Communication with Friends and Family: More than three times a week    Frequency of Social Gatherings with Friends and Family: More than three times a week    Attends  Religious Services: More than 4 times per year    Active Member of [employer] or Organizations: Yes    Attends Music therapist: More than 4 times per year    Marital Status: Married    Tobacco Counseling Counseling given: Not Answered Tobacco comments: not clear but did discuss AAA   Clinical Intake:  Pre-visit preparation completed: No  Pain : No/denies pain     BMI - recorded: 27.29 Nutritional Risks: None  How often do you need to have someone help you when you read instructions, pamphlets, or other written materials from your doctor or pharmacy?: 1 - Never  Diabetic?  No  Interpreter Needed?: No  Information entered by :: Rolene Arbour LPN   Activities of Daily Living    05/15/2022   12:39 PM  In your present state of health, do you have any difficulty performing the following activities:  Hearing? 0  Vision? 0  Difficulty concentrating or making decisions? 0  Walking or climbing stairs? 0  Dressing or bathing? 0  Doing errands, shopping? 0  Preparing Food and eating ? N  Using the Toilet? N  In the past six months, have you accidently leaked urine? N  Do you have problems with loss of bowel control? N  Managing your Medications? N  Managing your Finances? N  Housekeeping or managing your Housekeeping? N    Patient Care Team: Laurey Morale, MD as PCP - Gwenevere Abbot, Docia Chuck, MD as Consulting Physician (Gastroenterology) Dr. Ivory Broad (Proctology)  Indicate any recent Medical Services you may have received from other than [facility] providers in the past year (date may be approximate).     Assessment:   This is a routine wellness examination for [place].  Hearing/Vision screen Hearing Screening - Comments:: Denies hearing difficulties   Vision Screening - Comments:: Wears rx glasses - up to date with routine eye exams with  [place] issues and exercise activities discussed: Current Exercise Habits: Home exercise routine,  Type of exercise: stretching;walking, Time (Minutes): 60, Frequency (Times/Week): 3, Weekly Exercise (Minutes/Week): 180, Intensity: Moderate, Exercise limited by: None identified   Goals Addressed               This Visit's Progress     Stay Healthy (pt-stated)        Continue to take trips and stay alive       Depression Screen    05/15/2022   12:38 PM 03/29/2022    8:33 AM 05/09/2021   12:23 PM 03/27/2021    8:42 AM 03/31/2020    9:06 AM 03/25/2020    7:56 AM 08/21/2018    9:07 AM  PHQ 2/9 Scores  PHQ - 2 Score 0 0 0 0 0 0 0  PHQ- 9 Score 0 0  0 0  0    Fall Risk    05/15/2022   12:39 PM 05/10/2022   11:54 AM 03/29/2022    8:32 AM 05/09/2021   12:23 PM 05/08/2021    4:03 PM  Fall Risk   Falls in the past year? 0 0 0 0 0  Number falls in past yr: 0  0    Injury with Fall? 0  0    Risk for fall due to : No Fall Risks  No Fall Risks Medication side effect   Follow up Falls prevention discussed  Falls evaluation completed Falls evaluation completed;Education provided;Falls prevention discussed     FALL RISK PREVENTION PERTAINING TO THE HOME:  Any stairs in or around the home? Yes  If so, are there any without handrails? No  Home free of loose throw rugs in walkways, pet beds, electrical cords, etc? Yes  Adequate lighting in your home to reduce risk of falls? Yes   ASSISTIVE DEVICES UTILIZED TO PREVENT FALLS:  Life alert? No  Use of a cane, walker or w/c? No  Grab bars in the bathroom? No  Shower chair or bench in shower? Yes  Elevated toilet seat or a handicapped toilet? Yes   TIMED UP AND GO:  Was the test performed? No . Video Visit   Cognitive Function:        05/15/2022   12:41 PM 05/09/2021   12:24 PM  6CIT Screen  What Year? 0 points 0 points  What month? 0 points 0 points  What time? 0 points 0 points  Count back from 20 0 points 0 points  Months in reverse 0 points 0 points  Repeat phrase 0 points 2 points  Total Score 0 points 2 points     Immunizations Immunization History  Administered Date(s) Administered   Fluad Quad(high Dose 65+) 03/23/2019, 03/25/2020, 03/27/2021, 03/29/2022   Influenza Split 04/29/2012   Influenza Whole 04/25/2007, 03/22/2008, 04/18/2009, 04/18/2010   Influenza, High Dose Seasonal PF 04/01/2013, 03/05/2016, 03/06/2017, 03/07/2018   Influenza,inj,Quad PF,6+ Mos 02/17/2014, 02/21/2015   PFIZER(Purple Top)SARS-COV-2 Vaccination 08/26/2019, 09/16/2019, 05/18/2020   Pneumococcal Conjugate-13 02/17/2014   Pneumococcal Polysaccharide-23 05/04/2008   Tdap 08/27/2012   Zoster, Live 05/04/2008      Flu Vaccine status: Up to date  Pneumococcal vaccine status: Up to date  Covid-19 vaccine status: Completed vaccines  Qualifies for Shingles Vaccine? Yes   Zostavax completed No   Shingrix Completed?: No.    Education has been provided regarding the importance of this vaccine. Patient has been advised to call insurance company to determine out of pocket expense if they have not yet received this vaccine. Advised may also receive vaccine at local pharmacy or Health Dept. Verbalized acceptance and understanding.  Screening Tests Health Maintenance  Topic Date Due   COVID-19 Vaccine (4 - 2023-24 season) 04/25/2023 (Originally 02/23/2022)   Zoster Vaccines- Shingrix (1 of 2) 04/25/2023 (Originally 03/23/1993)   Hepatitis C Screening  05/16/2023 (Originally 03/23/1959)   Medicare Annual Wellness (AWV)  05/16/2023   Pneumonia Vaccine 35+ Years old  Completed   INFLUENZA VACCINE  Completed   HPV VACCINES  Aged Out   COLONOSCOPY (Pts 45-15yr Insurance coverage will need to be confirmed)  Discontinued    Health Maintenance  There are no preventive care reminders to display for this patient.   Colorectal cancer screening: No longer required.   Lung Cancer Screening: (Low Dose CT Chest recommended if Age 79-80years, 30 pack-year currently smoking OR have quit w/in 15years.) does not qualify.      Additional Screening:  Hepatitis C Screening: does not qualify; Completed   Vision Screening: Recommended annual ophthalmology exams for early detection of glaucoma and other disorders of the eye. Is the patient up to date with their annual eye exam?  Yes  Who is the provider or what is the name of the office in which the patient attends annual eye exams? [place] is not established with a provider, would they like to be referred to a provider to establish care? No .   Dental Screening: Recommended annual dental exams for proper oral hygiene  Community Resource Referral / Chronic Care Management:  CRR required this visit?  No   CCM required this visit?  No      Plan:     I have personally reviewed and noted the following in the patient's chart:   Medical and social history Use of alcohol, tobacco or illicit drugs  Current medications and supplements including opioid prescriptions. Patient is not currently taking opioid prescriptions. Functional ability and status Nutritional status Physical activity Advanced directives List of other physicians Hospitalizations, surgeries, and ER visits  in previous 12 months Vitals Screenings to include cognitive, depression, and falls Referrals and appointments  In addition, I have reviewed and discussed with patient certain preventive protocols, quality metrics, and best practice recommendations. A written personalized care plan for preventive services as well as general preventive health recommendations were provided to patient.     Criselda Peaches, LPN   [id number]   Nurse Notes: None

## 2022-05-15 NOTE — Patient Instructions (Addendum)
Mason [name] , Thank you for taking time to come for your Medicare Wellness Visit. I appreciate your ongoing commitment to your health goals. Please review the following plan we discussed and let me know if I can assist you in the future.   These are the goals we discussed:  Goals       Patient Stated      Lose about 5 pounds by next year by continuing to drink more water, keeping up exercise routine.      Patient Stated      I will continue to exercise3-5 days per week      Patient Stated      05/09/2021, continue to monitor prostate      Stay Healthy (pt-stated)      Continue to take trips and stay alive      Weight (lb) < 155 lb (70.3 kg)      Try to diary your food intake for calorie consumption and energy expenditure   Try to do what you did the last time!        This is a list of the screening recommended for you and due dates:  Health Maintenance  Topic Date Due   COVID-19 Vaccine (4 - 2023-24 season) 04/25/2023*   Zoster (Shingles) Vaccine (1 of 2) 04/25/2023*   Hepatitis C Screening: USPSTF Recommendation to screen - Ages 18-79 yo.  05/16/2023*   Medicare Annual Wellness Visit  05/16/2023   Pneumonia Vaccine  Completed   Flu Shot  Completed   HPV Vaccine  Aged Out   Colon Cancer Screening  Discontinued  *Topic was postponed. The date shown is not the original due date.    Advanced directives: Please bring a copy of your health care power of attorney and living will to the office to be added to your chart at your convenience.   Conditions/risks identified: None  Next appointment: Follow up in one year for your annual wellness visit.    Preventive Care 14 Years and Older, Male  Preventive care refers to lifestyle choices and visits with your health care provider that can promote health and wellness. What does preventive care include? A yearly physical exam. This is also called an annual well check. Dental exams once or twice a year. Routine eye exams. Ask  your health care provider how often you should have your eyes checked. Personal lifestyle choices, including: Daily care of your teeth and gums. Regular physical activity. Eating a healthy diet. Avoiding tobacco and drug use. Limiting alcohol use. Practicing safe sex. Taking low doses of aspirin every day. Taking vitamin and mineral supplements as recommended by your health care provider. What happens during an annual well check? The services and screenings done by your health care provider during your annual well check will depend on your age, overall health, lifestyle risk factors, and family history of disease. Counseling  Your health care provider may ask you questions about your: Alcohol use. Tobacco use. Drug use. Emotional well-being. Home and relationship well-being. Sexual activity. Eating habits. History of falls. Memory and ability to understand (cognition). Work and work Statistician. Screening  You may have the following tests or measurements: Height, weight, and BMI. Blood pressure. Lipid and cholesterol levels. These may be checked every 5 years, or more frequently if you are over 13 years old. Skin check. Lung cancer screening. You may have this screening every year starting at age 8 if you have a 30-pack-year history of smoking and currently smoke or have quit  within the past 15 years. Fecal occult blood test (FOBT) of the stool. You may have this test every year starting at age 10. Flexible sigmoidoscopy or colonoscopy. You may have a sigmoidoscopy every 5 years or a colonoscopy every 10 years starting at age 23. Prostate cancer screening. Recommendations will vary depending on your family history and other risks. Hepatitis C blood test. Hepatitis B blood test. Sexually transmitted disease (STD) testing. Diabetes screening. This is done by checking your blood sugar (glucose) after you have not eaten for a while (fasting). You may have this done every 1-3  years. Abdominal aortic aneurysm (AAA) screening. You may need this if you are a current or former smoker. Osteoporosis. You may be screened starting at age 54 if you are at high risk. Talk with your health care provider about your test results, treatment options, and if necessary, the need for more tests. Vaccines  Your health care provider may recommend certain vaccines, such as: Influenza vaccine. This is recommended every year. Tetanus, diphtheria, and acellular pertussis (Tdap, Td) vaccine. You may need a Td booster every 10 years. Zoster vaccine. You may need this after age 68. Pneumococcal 13-valent conjugate (PCV13) vaccine. One dose is recommended after age 33. Pneumococcal polysaccharide (PPSV23) vaccine. One dose is recommended after age 8. Talk to your health care provider about which screenings and vaccines you need and how often you need them. This information is not intended to replace advice given to you by your health care provider. Make sure you discuss any questions you have with your health care provider. Document Released: 07/08/2015 Document Revised: 02/29/2016 Document Reviewed: 04/12/2015 Elsevier Interactive Patient Education  2017 [place] Prevention in the Home Falls can cause injuries. They can happen to people of all ages. There are many things you can do to make your home safe and to help prevent falls. What can I do on the outside of my home? Regularly fix the edges of walkways and driveways and fix any cracks. Remove anything that might make you trip as you walk through a door, such as a raised step or threshold. Trim any bushes or trees on the path to your home. Use bright outdoor lighting. Clear any walking paths of anything that might make someone trip, such as rocks or tools. Regularly check to see if handrails are loose or broken. Make sure that both sides of any steps have handrails. Any raised decks and porches should have guardrails on the  edges. Have any leaves, snow, or ice cleared regularly. Use sand or salt on walking paths during winter. Clean up any spills in your garage right away. This includes oil or grease spills. What can I do in the bathroom? Use night lights. Install grab bars by the toilet and in the tub and shower. Do not use towel bars as grab bars. Use non-skid mats or decals in the tub or shower. If you need to sit down in the shower, use a plastic, non-slip stool. Keep the floor dry. Clean up any water that spills on the floor as soon as it happens. Remove soap buildup in the tub or shower regularly. Attach bath mats securely with double-sided non-slip rug tape. Do not have throw rugs and other things on the floor that can make you trip. What can I do in the bedroom? Use night lights. Make sure that you have a light by your bed that is easy to reach. Do not use any sheets or blankets that are too  big for your bed. They should not hang down onto the floor. Have a firm chair that has side arms. You can use this for support while you get dressed. Do not have throw rugs and other things on the floor that can make you trip. What can I do in the kitchen? Clean up any spills right away. Avoid walking on wet floors. Keep items that you use a lot in easy-to-reach places. If you need to reach something above you, use a strong step stool that has a grab bar. Keep electrical cords out of the way. Do not use floor polish or wax that makes floors slippery. If you must use wax, use non-skid floor wax. Do not have throw rugs and other things on the floor that can make you trip. What can I do with my stairs? Do not leave any items on the stairs. Make sure that there are handrails on both sides of the stairs and use them. Fix handrails that are broken or loose. Make sure that handrails are as long as the stairways. Check any carpeting to make sure that it is firmly attached to the stairs. Fix any carpet that is loose or  worn. Avoid having throw rugs at the top or bottom of the stairs. If you do have throw rugs, attach them to the floor with carpet tape. Make sure that you have a light switch at the top of the stairs and the bottom of the stairs. If you do not have them, ask someone to add them for you. What else can I do to help prevent falls? Wear shoes that: Do not have high heels. Have rubber bottoms. Are comfortable and fit you well. Are closed at the toe. Do not wear sandals. If you use a stepladder: Make sure that it is fully opened. Do not climb a closed stepladder. Make sure that both sides of the stepladder are locked into place. Ask someone to hold it for you, if possible. Clearly mark and make sure that you can see: Any grab bars or handrails. First and last steps. Where the edge of each step is. Use tools that help you move around (mobility aids) if they are needed. These include: Canes. Walkers. Scooters. Crutches. Turn on the lights when you go into a dark area. Replace any light bulbs as soon as they burn out. Set up your furniture so you have a clear path. Avoid moving your furniture around. If any of your floors are uneven, fix them. If there are any pets around you, be aware of where they are. Review your medicines with your doctor. Some medicines can make you feel dizzy. This can increase your chance of falling. Ask your doctor what other things that you can do to help prevent falls. This information is not intended to replace advice given to you by your health care provider. Make sure you discuss any questions you have with your health care provider. Document Released: 04/07/2009 Document Revised: 11/17/2015 Document Reviewed: 07/16/2014 Elsevier Interactive Patient Education  2017 [employer].

## 2022-06-07 DIAGNOSIS — L820 Inflamed seborrheic keratosis: Secondary | ICD-10-CM | POA: Diagnosis not present

## 2022-06-07 DIAGNOSIS — D485 Neoplasm of uncertain behavior of skin: Secondary | ICD-10-CM | POA: Diagnosis not present

## 2022-06-07 DIAGNOSIS — L821 Other seborrheic keratosis: Secondary | ICD-10-CM | POA: Diagnosis not present

## 2022-06-27 ENCOUNTER — Other Ambulatory Visit: Payer: Self-pay | Admitting: Family Medicine

## 2022-07-02 DIAGNOSIS — R972 Elevated prostate specific antigen [PSA]: Secondary | ICD-10-CM | POA: Diagnosis not present

## 2022-07-02 DIAGNOSIS — N401 Enlarged prostate with lower urinary tract symptoms: Secondary | ICD-10-CM | POA: Diagnosis not present

## 2022-07-09 DIAGNOSIS — L538 Other specified erythematous conditions: Secondary | ICD-10-CM | POA: Diagnosis not present

## 2022-07-09 DIAGNOSIS — Z789 Other specified health status: Secondary | ICD-10-CM | POA: Diagnosis not present

## 2022-07-09 DIAGNOSIS — D485 Neoplasm of uncertain behavior of skin: Secondary | ICD-10-CM | POA: Diagnosis not present

## 2022-07-09 DIAGNOSIS — D225 Melanocytic nevi of trunk: Secondary | ICD-10-CM | POA: Diagnosis not present

## 2022-07-09 DIAGNOSIS — L728 Other follicular cysts of the skin and subcutaneous tissue: Secondary | ICD-10-CM | POA: Diagnosis not present

## 2022-07-09 DIAGNOSIS — L298 Other pruritus: Secondary | ICD-10-CM | POA: Diagnosis not present

## 2022-07-09 DIAGNOSIS — L570 Actinic keratosis: Secondary | ICD-10-CM | POA: Diagnosis not present

## 2022-07-09 DIAGNOSIS — L821 Other seborrheic keratosis: Secondary | ICD-10-CM | POA: Diagnosis not present

## 2022-07-09 DIAGNOSIS — L814 Other melanin hyperpigmentation: Secondary | ICD-10-CM | POA: Diagnosis not present

## 2022-07-09 DIAGNOSIS — L820 Inflamed seborrheic keratosis: Secondary | ICD-10-CM | POA: Diagnosis not present

## 2022-08-01 DIAGNOSIS — L821 Other seborrheic keratosis: Secondary | ICD-10-CM | POA: Diagnosis not present

## 2022-08-01 DIAGNOSIS — L570 Actinic keratosis: Secondary | ICD-10-CM | POA: Diagnosis not present

## 2022-08-01 DIAGNOSIS — L814 Other melanin hyperpigmentation: Secondary | ICD-10-CM | POA: Diagnosis not present

## 2022-09-26 ENCOUNTER — Encounter: Payer: Self-pay | Admitting: Family Medicine

## 2022-10-09 ENCOUNTER — Encounter: Payer: Self-pay | Admitting: Family Medicine

## 2022-10-10 NOTE — Telephone Encounter (Signed)
Please give them my permission to let him have the yellow fever vaccine

## 2022-10-10 NOTE — Telephone Encounter (Signed)
I called the [facility] at [phone] for more information and spoke with Avis, nursing supervisor over immunizations and travel clinic.  Avis stated due to the neurological side effects and risks of the live yellow fever vaccine, they require permission or a written statement from a provider as this could cause a flare-up of certain medical problems.  Message sent to PCP.

## 2022-10-17 ENCOUNTER — Other Ambulatory Visit: Payer: Self-pay

## 2022-11-08 DIAGNOSIS — N401 Enlarged prostate with lower urinary tract symptoms: Secondary | ICD-10-CM | POA: Diagnosis not present

## 2022-11-08 DIAGNOSIS — R972 Elevated prostate specific antigen [PSA]: Secondary | ICD-10-CM | POA: Diagnosis not present

## 2022-11-12 DIAGNOSIS — R351 Nocturia: Secondary | ICD-10-CM | POA: Diagnosis not present

## 2022-11-12 DIAGNOSIS — R972 Elevated prostate specific antigen [PSA]: Secondary | ICD-10-CM | POA: Diagnosis not present

## 2022-11-12 DIAGNOSIS — N401 Enlarged prostate with lower urinary tract symptoms: Secondary | ICD-10-CM | POA: Diagnosis not present

## 2023-01-28 DIAGNOSIS — H1132 Conjunctival hemorrhage, left eye: Secondary | ICD-10-CM | POA: Diagnosis not present

## 2023-02-13 DIAGNOSIS — Z7189 Other specified counseling: Secondary | ICD-10-CM | POA: Diagnosis not present

## 2023-02-13 DIAGNOSIS — L821 Other seborrheic keratosis: Secondary | ICD-10-CM | POA: Diagnosis not present

## 2023-02-13 DIAGNOSIS — L570 Actinic keratosis: Secondary | ICD-10-CM | POA: Diagnosis not present

## 2023-02-13 DIAGNOSIS — D225 Melanocytic nevi of trunk: Secondary | ICD-10-CM | POA: Diagnosis not present

## 2023-02-13 DIAGNOSIS — D2239 Melanocytic nevi of other parts of face: Secondary | ICD-10-CM | POA: Diagnosis not present

## 2023-02-13 DIAGNOSIS — L814 Other melanin hyperpigmentation: Secondary | ICD-10-CM | POA: Diagnosis not present

## 2023-02-13 DIAGNOSIS — Z85828 Personal history of other malignant neoplasm of skin: Secondary | ICD-10-CM | POA: Diagnosis not present

## 2023-03-28 DIAGNOSIS — D3131 Benign neoplasm of right choroid: Secondary | ICD-10-CM | POA: Diagnosis not present

## 2023-03-28 DIAGNOSIS — H2513 Age-related nuclear cataract, bilateral: Secondary | ICD-10-CM | POA: Diagnosis not present

## 2023-03-28 DIAGNOSIS — H43813 Vitreous degeneration, bilateral: Secondary | ICD-10-CM | POA: Diagnosis not present

## 2023-04-01 ENCOUNTER — Encounter: Payer: Self-pay | Admitting: Family Medicine

## 2023-04-01 ENCOUNTER — Ambulatory Visit (INDEPENDENT_AMBULATORY_CARE_PROVIDER_SITE_OTHER): Payer: Medicare Other | Admitting: Family Medicine

## 2023-04-01 VITALS — BP 130/78 | HR 66 | Temp 97.9°F | Ht 63.5 in | Wt 160.8 lb

## 2023-04-01 DIAGNOSIS — I10 Essential (primary) hypertension: Secondary | ICD-10-CM

## 2023-04-01 DIAGNOSIS — R739 Hyperglycemia, unspecified: Secondary | ICD-10-CM

## 2023-04-01 DIAGNOSIS — N401 Enlarged prostate with lower urinary tract symptoms: Secondary | ICD-10-CM | POA: Diagnosis not present

## 2023-04-01 DIAGNOSIS — R972 Elevated prostate specific antigen [PSA]: Secondary | ICD-10-CM

## 2023-04-01 DIAGNOSIS — M150 Primary generalized (osteo)arthritis: Secondary | ICD-10-CM | POA: Diagnosis not present

## 2023-04-01 DIAGNOSIS — E039 Hypothyroidism, unspecified: Secondary | ICD-10-CM | POA: Diagnosis not present

## 2023-04-01 DIAGNOSIS — N138 Other obstructive and reflux uropathy: Secondary | ICD-10-CM

## 2023-04-01 DIAGNOSIS — Z23 Encounter for immunization: Secondary | ICD-10-CM | POA: Diagnosis not present

## 2023-04-01 DIAGNOSIS — E782 Mixed hyperlipidemia: Secondary | ICD-10-CM | POA: Diagnosis not present

## 2023-04-01 LAB — HEMOGLOBIN A1C: Hgb A1c MFr Bld: 5.5 % (ref 4.6–6.5)

## 2023-04-01 LAB — CBC WITH DIFFERENTIAL/PLATELET
Basophils Absolute: 0 10*3/uL (ref 0.0–0.1)
Basophils Relative: 0.8 % (ref 0.0–3.0)
Eosinophils Absolute: 0.1 10*3/uL (ref 0.0–0.7)
Eosinophils Relative: 2.2 % (ref 0.0–5.0)
HCT: 41.9 % (ref 39.0–52.0)
Hemoglobin: 14.5 g/dL (ref 13.0–17.0)
Lymphocytes Relative: 24.3 % (ref 12.0–46.0)
Lymphs Abs: 1.4 10*3/uL (ref 0.7–4.0)
MCHC: 34.6 g/dL (ref 30.0–36.0)
MCV: 91.1 fL (ref 78.0–100.0)
Monocytes Absolute: 0.5 10*3/uL (ref 0.1–1.0)
Monocytes Relative: 8.7 % (ref 3.0–12.0)
Neutro Abs: 3.6 10*3/uL (ref 1.4–7.7)
Neutrophils Relative %: 64 % (ref 43.0–77.0)
Platelets: 230 10*3/uL (ref 150.0–400.0)
RBC: 4.6 Mil/uL (ref 4.22–5.81)
RDW: 13.2 % (ref 11.5–15.5)
WBC: 5.7 10*3/uL (ref 4.0–10.5)

## 2023-04-01 LAB — HEPATIC FUNCTION PANEL
ALT: 18 U/L (ref 0–53)
AST: 22 U/L (ref 0–37)
Albumin: 4.2 g/dL (ref 3.5–5.2)
Alkaline Phosphatase: 49 U/L (ref 39–117)
Bilirubin, Direct: 0.1 mg/dL (ref 0.0–0.3)
Total Bilirubin: 0.6 mg/dL (ref 0.2–1.2)
Total Protein: 6.2 g/dL (ref 6.0–8.3)

## 2023-04-01 LAB — BASIC METABOLIC PANEL
BUN: 15 mg/dL (ref 6–23)
CO2: 26 meq/L (ref 19–32)
Calcium: 9.3 mg/dL (ref 8.4–10.5)
Chloride: 101 meq/L (ref 96–112)
Creatinine, Ser: 0.97 mg/dL (ref 0.40–1.50)
GFR: 73.98 mL/min (ref >60.00)
Glucose, Bld: 95 mg/dL (ref 70–99)
Potassium: 3.2 meq/L — ABNORMAL LOW (ref 3.5–5.1)
Sodium: 136 meq/L (ref 135–145)

## 2023-04-01 LAB — LIPID PANEL
Cholesterol: 140 mg/dL (ref 0–200)
HDL: 60.8 mg/dL (ref >39.00)
LDL Cholesterol: 68 mg/dL (ref 0–99)
NonHDL: 79.44
Total CHOL/HDL Ratio: 2
Triglycerides: 56 mg/dL (ref 0.0–149.0)
VLDL: 11.2 mg/dL (ref 0.0–40.0)

## 2023-04-01 LAB — T4, FREE: Free T4: 0.97 ng/dL (ref 0.60–1.60)

## 2023-04-01 LAB — T3, FREE: T3, Free: 2.9 pg/mL (ref 2.3–4.2)

## 2023-04-01 LAB — TSH: TSH: 2.81 u[IU]/mL (ref 0.35–5.50)

## 2023-04-01 MED ORDER — HYDROCHLOROTHIAZIDE 25 MG PO TABS: 25.0000 mg | ORAL_TABLET | Freq: Every day | ORAL | 3 refills | Status: DC

## 2023-04-01 MED ORDER — LOSARTAN POTASSIUM 100 MG PO TABS: 100.0000 mg | ORAL_TABLET | Freq: Every day | ORAL | 3 refills | Status: DC

## 2023-04-01 MED ORDER — ROSUVASTATIN CALCIUM 10 MG PO TABS: 10.0000 mg | ORAL_TABLET | Freq: Every day | ORAL | 3 refills | Status: DC

## 2023-04-01 MED ORDER — POTASSIUM CHLORIDE ER 10 MEQ PO TBCR: 10.0000 meq | EXTENDED_RELEASE_TABLET | Freq: Every day | ORAL | 3 refills | Status: DC

## 2023-04-01 MED ORDER — LEVOTHYROXINE SODIUM 75 MCG PO TABS: 75.0000 ug | ORAL_TABLET | Freq: Every day | ORAL | 3 refills | Status: DC

## 2023-04-01 NOTE — Progress Notes (Signed)
Subjective:    Patient ID: Mason [name], male    DOB: 09/24/1940, 82 y.o.   MRN: [id number]  HPI Here to follow up on issues. He is doing well in general. His BP is stable, although it occasionally goes into the 140's over 90. Usually it is in the 130's over 80's. His OA is stable. He sometimes takes some Ibuprofen at bedtime if he has been very active that day. He goes to [place] frequently, and he sees a Insurance underwriter done there. In May he had an exam and a PSA came back at 4.1 (which is a bit of a decrease).    Review of Systems  Constitutional: Negative.   HENT: Negative.    Eyes: Negative.   Respiratory: Negative.    Cardiovascular: Negative.   Gastrointestinal: Negative.   Genitourinary: Negative.   Musculoskeletal:  Positive for arthralgias and back pain.  Skin: Negative.   Neurological: Negative.   Psychiatric/Behavioral: Negative.         Objective:   Physical Exam Constitutional:      General: He is not in acute distress.    Appearance: Normal appearance. He is well-developed. He is not diaphoretic.  HENT:     Head: Normocephalic and atraumatic.     Right Ear: External ear normal.     Left Ear: External ear normal.     Nose: Nose normal.     Mouth/Throat:     Pharynx: No oropharyngeal exudate.  Eyes:     General: No scleral icterus.       Right eye: No discharge.        Left eye: No discharge.     Conjunctiva/sclera: Conjunctivae normal.     Pupils: Pupils are equal, round, and reactive to light.  Neck:     Thyroid: No thyromegaly.     Vascular: No JVD.     Trachea: No tracheal deviation.  Cardiovascular:     Rate and Rhythm: Normal rate and regular rhythm.     Pulses: Normal pulses.     Heart sounds: Normal heart sounds. No murmur heard.    No friction rub. No gallop.  Pulmonary:     Effort: Pulmonary effort is normal. No respiratory distress.     Breath sounds: Normal breath sounds. No wheezing or rales.  Chest:     Chest wall: No tenderness.   Abdominal:     General: Bowel sounds are normal. There is no distension.     Palpations: Abdomen is soft. There is no mass.     Tenderness: There is no abdominal tenderness. There is no guarding or rebound.  Genitourinary:    Penis: No tenderness.   Musculoskeletal:        General: No tenderness. Normal range of motion.     Cervical back: Neck supple.  Lymphadenopathy:     Cervical: No cervical adenopathy.  Skin:    General: Skin is warm and dry.     Coloration: Skin is not pale.     Findings: No erythema or rash.  Neurological:     General: No focal deficit present.     Mental Status: He is alert and oriented to person, place, and time.     Cranial Nerves: No cranial nerve deficit.     Motor: No abnormal muscle tone.     Coordination: Coordination normal.     Deep Tendon Reflexes: Reflexes are normal and symmetric. Reflexes normal.  Psychiatric:        Mood and Affect: Mood  normal.        Behavior: Behavior normal.        Thought Content: Thought content normal.        Judgment: Judgment normal.           Assessment & Plan:  His HTN and OA are stable. We will get fasting labs to check lipids, thyroid levels, etc. He will follow up with his Urologist in [place]. We spent a total of ( 33  ) minutes reviewing records and discussing these issues.  Gershon Crane, MD

## 2023-04-04 ENCOUNTER — Other Ambulatory Visit: Payer: Self-pay

## 2023-04-04 MED ORDER — POTASSIUM CHLORIDE ER 10 MEQ PO TBCR: EXTENDED_RELEASE_TABLET | ORAL | 3 refills | Status: DC

## 2023-04-12 ENCOUNTER — Other Ambulatory Visit: Payer: Self-pay

## 2023-05-28 ENCOUNTER — Ambulatory Visit (INDEPENDENT_AMBULATORY_CARE_PROVIDER_SITE_OTHER): Payer: Medicare Other | Admitting: Family Medicine

## 2023-05-28 DIAGNOSIS — Z0000 Encounter for general adult medical examination without abnormal findings: Secondary | ICD-10-CM

## 2023-05-28 NOTE — Progress Notes (Signed)
"  Patient was unable to self-report due to a lack of equipment at home via telehealth"

## 2023-05-28 NOTE — Progress Notes (Signed)
PATIENT CHECK-IN and HEALTH RISK ASSESSMENT QUESTIONNAIRE:  -completed by phone/video for upcoming Medicare Preventive Visit  Pre-Visit Check-in: 1)Vitals (height, wt, BP, etc) - record in vitals section for visit on day of visit Request home vitals (wt, BP, etc.) and enter into vitals, THEN update Vital Signs SmartPhrase below at the top of the HPI. See below.  2)Review and Update Medications, Allergies PMH, Surgeries, Social history in Epic 3)Hospitalizations in the last year with date/reason? NO   4)Review and Update Care Team (patient's specialists) in Epic 5) Complete PHQ9 in Epic  6) Complete Fall Screening in Epic 7)Review all Health Maintenance Due and order under PCP if not done.  Medicare Wellness Patient Questionnaire:  Answer theses question about your habits: How often do you have a drink containing alcohol?NO  How many drinks containing alcohol do you have on a typical day when you are drinking?Na  How often do you have six or more drinks on one occasion?NA  Have you ever smoked?Yes Quit date if applicable? 1970  How many packs a day do/did you smoke? Less then 1 Do you use smokeless tobacco?no  Do you use an illicit drugs?no  On average, how many days per week do you engage in moderate to strenuous exercise (like a brisk walk)?Yes On average, how many minutes do you engage in exercise at this level?golf and walk, 2-3 times a week, 30-45 mins Are you sexually active? NO Number of partners?NA  Typical breakfast: toast, yogurt, eggs,  Typical lunch: sandwich, or Chick fil a  Typical dinner: Meat, Vegetables, salad  Typical snacks: Chips  Beverages: Water, Milk, Coffee, Ice tea  Mostly eats at home, mostly unprocessed Good social connection in neighborhood and at church  Answer theses question about your everyday activities: Can you perform most household chores?yes  Are you deaf or have significant trouble hearing?NO  Do you feel that you have a problem with  memory?sometimes, not concerned - occasionally forgets something but then it comes to him Do you feel safe at home?Yes  Last dentist visit?Sept 2024 8. Do you have any difficulty performing your everyday activities?NO  Are you having any difficulty walking, taking medications on your own, and or difficulty managing daily home needs?NO  Do you have difficulty walking or climbing stairs?NO  Do you have difficulty dressing or bathing?NO  Do you have difficulty doing errands alone such as visiting a doctor's office or shopping?NO  Do you currently have any difficulty preparing food and eating?NO  Do you currently have any difficulty using the toilet?NO  Do you have any difficulty managing your finances?NO  Do you have any difficulties with housekeeping of managing your housekeeping?NO    Do you have Advanced Directives in place (Living Will, Healthcare Power or Attorney)? Yes    Last eye Exam and location?NOV 2024, [facility]    Do you currently use prescribed or non-prescribed narcotic or opioid pain medications?NO   Do you have a history or close family history of breast, ovarian, tubal or peritoneal cancer or a family member with BRCA (breast cancer susceptibility 1 and 2) gene mutations? NO just colon cancer   Request home vitals (wt, BP, etc.) and enter into vitals, THEN update Vital Signs SmartPhrase below at the top of the HPI. See below.   Nurse/Assistant Credentials/time stamp: Stann Ore CMA 11:41 am    ----------------------------------------------------------------------------------------------------------------------------------------------------------------------------------------------------------------------  Because this visit was a virtual/telehealth visit, some criteria may be missing or patient reported. Any vitals not documented were not able  to be obtained and vitals that have been documented are patient reported.    MEDICARE ANNUAL PREVENTIVE CARE VISIT  WITH PROVIDER (Welcome to Medicare, initial annual wellness or annual wellness exam)  Virtual Visit via Video Note  I connected with Suzan Nailer on 05/28/23  by a video enabled telemedicine application and verified that I am speaking with the correct person using two identifiers.  Location patient: home Location provider:work or home office Persons participating in the virtual visit: patient, provider  Concerns and/or follow up today: nothing new - just got back from a cruise, had a cold - but over it now.    See HM section in Epic for other details of completed HM.    ROS: negative for report of fevers, unintentional weight loss, vision changes, vision loss, hearing loss or change, chest pain, sob, hemoptysis, melena, hematochezia, hematuria, falls, bleeding or bruising, thoughts of suicide or self harm, memory loss  Patient-completed extensive health risk assessment - reviewed and discussed with the patient: See Health Risk Assessment completed with patient prior to the visit either above or in recent phone note. This was reviewed in detailed with the patient today and appropriate recommendations, orders and referrals were placed as needed per Summary below and patient instructions.   Review of Medical History: -PMH, PSH, Family History and current specialty and care providers reviewed and updated and listed below   Patient Care Team: Nelwyn Salisbury, MD as PCP - General Marina Goodell Wilhemina Bonito, MD as Consulting Physician (Gastroenterology) Dr. Lowella Curb (Proctology)   Past Medical History:  Diagnosis Date   Benign prostatic hypertrophy with elevated PSA    sees Dr. Vincente Poli in [facility]   GERD (gastroesophageal reflux disease)    Hyperlipidemia    Hypertension    Normal cardiac stress test 09/04/2012   Osteoporosis    Thyroid disease    hypothyroidism    Past Surgical History:  Procedure Laterality Date   BASAL CELL CARCINOMA EXCISION  2017   off nose, per Dr. Massie Maroon     COLONOSCOPY  05/07/2022   per Dr. Marina Goodell, adenomatous polyp, no reepeats needed   PROSTATE BIOPSY     x6   TONSILLECTOMY     WISDOM TOOTH EXTRACTION      Social History   Socioeconomic History   Marital status: Married    Spouse name: Not on file   Number of children: 2   Years of education: Not on file   Highest education level: Not on file  Occupational History   Occupation: Engineering/business    Comment: Cabin crew, retired  Tobacco Use   Smoking status: Former    Current packs/day: 0.00    Types: Cigarettes    Quit date: 06/25/1968    Years since quitting: 54.9   Smokeless tobacco: Never   Tobacco comments:    not clear but did discuss AAA  Vaping Use   Vaping status: Never Used  Substance and Sexual Activity   Alcohol use: No    Alcohol/week: 0.0 standard drinks of alcohol   Drug use: No   Sexual activity: Not on file  Other Topics Concern   Not on file  Social History Narrative   Lives with wife on one level   Has daughter in [place], one daughter in [place] with four grandchildren   Enjoys travelling with wife; goes to house in [facility] frequently   Attends bible study    Exercises at gym and/or outside routinely, doing yoga, biking   Social Determinants  of Health   Financial Resource Strain: Low Risk  (05/15/2022)   Overall Financial Resource Strain (CARDIA)    Difficulty of Paying Living Expenses: Not hard at all  Food Insecurity: No Food Insecurity (05/15/2022)   Hunger Vital Sign    Worried About Running Out of Food in the Last Year: Never true    Ran Out of Food in the Last Year: Never true  Transportation Needs: No Transportation Needs (05/15/2022)   PRAPARE - Administrator, Civil Service (Medical): No    Lack of Transportation (Non-Medical): No  Physical Activity: Sufficiently Active (05/15/2022)   Exercise Vital Sign    Days of Exercise per Week: 3 days    Minutes of Exercise per Session: 60 min  Stress: No Stress Concern Present  (05/15/2022)   [employer] of Occupational Health - Occupational Stress Questionnaire    Feeling of Stress : Not at all  Social Connections: Socially Integrated (05/15/2022)   Social Connection and Isolation Panel [NHANES]    Frequency of Communication with Friends and Family: More than three times a week    Frequency of Social Gatherings with Friends and Family: More than three times a week    Attends Religious Services: More than 4 times per year    Active Member of [employer] or Organizations: Yes    Attends Engineer, structural: More than 4 times per year    Marital Status: Married  Catering manager Violence: Not At Risk (05/15/2022)   Humiliation, Afraid, Rape, and Kick questionnaire    Fear of Current or Ex-Partner: No    Emotionally Abused: No    Physically Abused: No    Sexually Abused: No    Family History  Problem Relation Age of Onset   Heart disease Mother    Heart disease Father    Colon cancer Brother        late 50's   Coronary artery disease Other        family hx   Aneurysm Other        family hx   Cancer Other        colon   Colon cancer Other        family hx   Esophageal cancer Neg Hx    Stomach cancer Neg Hx     Current Outpatient Medications on File Prior to Visit  Medication Sig Dispense Refill   finasteride (PROSCAR) 5 MG tablet      hydrochlorothiazide (HYDRODIURIL) 25 MG tablet Take 1 tablet (25 mg total) by mouth daily. 90 tablet 3   levothyroxine (SYNTHROID) 75 MCG tablet Take 1 tablet (75 mcg total) by mouth daily. 90 tablet 3   losartan (COZAAR) 100 MG tablet Take 1 tablet (100 mg total) by mouth daily. 90 tablet 3   potassium chloride (KLOR-CON 10) 10 MEQ tablet Take 1 tabs twice daily (total of 20 mEq) 180 tablet 3   rosuvastatin (CRESTOR) 10 MG tablet Take 1 tablet (10 mg total) by mouth daily. 90 tablet 3   No current facility-administered medications on file prior to visit.    No Known Allergies     Physical  Exam Vitals requested from patient and listed below if patient had equipment and was able to obtain at home for this virtual visit: There were no vitals filed for this visit. Estimated body mass index is 28.04 kg/m as calculated from the following:   Height as of 04/01/23: 5' 3.5" (1.613 m).   Weight as of 04/01/23:  160 lb 12.8 oz (72.9 kg).  EKG (optional): deferred due to virtual visit  GENERAL: alert, oriented, no acute distress detected; full vision exam deferred due to pandemic and/or virtual encounter   HEENT: atraumatic, conjunttiva clear, no obvious abnormalities on inspection of external nose and ears  NECK: normal movements of the head and neck  LUNGS: on inspection no signs of respiratory distress, breathing rate appears normal, no obvious gross SOB, gasping or wheezing  CV: no obvious cyanosis  MS: moves all visible extremities without noticeable abnormality  PSYCH/NEURO: pleasant and cooperative, no obvious depression or anxiety, speech and thought processing grossly intact, Cognitive function grossly intact  Flowsheet Row Office Visit from 04/01/2023 in [facility] [place]  PHQ-9 Total Score 0           05/28/2023   11:30 AM 04/01/2023    8:25 AM 05/15/2022   12:38 PM 03/29/2022    8:33 AM 05/09/2021   12:23 PM  Depression screen PHQ 2/9  Decreased Interest 0 0 0 0 0  Down, Depressed, Hopeless 0 0 0 0 0  PHQ - 2 Score 0 0 0 0 0  Altered sleeping  0 0 0   Tired, decreased energy  0 0 0   Change in appetite  0 0 0   Feeling bad or failure about yourself   0 0 0   Trouble concentrating  0 0 0   Moving slowly or fidgety/restless  0 0 0   Suicidal thoughts  0 0 0   PHQ-9 Score  0 0 0   Difficult doing work/chores  Not difficult at all Not difficult at all Not difficult at all        05/10/2022   11:54 AM 05/15/2022   12:39 PM 04/01/2023    8:24 AM 05/27/2023   10:06 AM 05/28/2023   11:29 AM  Fall Risk  Falls in the past year? 0 0 0  0 0  Was there an injury with Fall?  0 0  0  Fall Risk Category Calculator  0 0  0  Fall Risk Category (Retired)  Low     (RETIRED) Patient Fall Risk Level  Low fall risk     Patient at Risk for Falls Due to  No Fall Risks No Fall Risks  No Fall Risks  Fall risk Follow up  Falls prevention discussed Falls evaluation completed  Falls evaluation completed     SUMMARY AND PLAN:  Encounter for Medicare annual wellness exam   Discussed applicable health maintenance/preventive health measures and advised and referred or ordered per patient preferences: -discussed vaccines due and advised, he can get at the pharmacy if decides to do Health Maintenance  Topic Date Due   COVID-19 Vaccine (4 - 2023-24 season) 06/13/2023 (Originally 02/24/2023)   Zoster Vaccines- Shingrix (1 of 2) 06/20/2023 (Originally 03/23/1993)   Medicare Annual Wellness (AWV)  05/27/2024   DTaP/Tdap/Td (3 - Td or Tdap) 11/26/2032   Pneumonia Vaccine 60+ Years old  Completed   INFLUENZA VACCINE  Completed   HPV VACCINES  Aged Out   Colonoscopy  Discontinued     Education and counseling on the following was provided based on the above review of health and a plan/checklist for the patient, along with additional information discussed, was provided for the patient in the patient instructions :  -Provided safe balance exercises that can be done at home to improve balance and discussed exercise guidelines for adults with include balance exercises at  least 3 days per week.  -Advised and counseled on a healthy lifestyle - including the importance of a healthy diet, regular physical activity, social connections and stress management. -Reviewed patient's current diet. Advised and counseled on a whole foods based healthy diet. A summary of a healthy diet was provided in the Patient Instructions.  -reviewed patient's current physical activity level and discussed exercise guidelines for adults. Discussed community resources and ideas for  safe exercise at home to assist in meeting exercise guideline recommendations in a safe and healthy way.  -Advise yearly dental visits at minimum and regular eye exams   Follow up: see patient instructions   Patient Instructions  I really enjoyed getting to talk with you today! I am available on Tuesdays and Thursdays for virtual visits if you have any questions or concerns, or if I can be of any further assistance.   CHECKLIST FROM ANNUAL WELLNESS VISIT:  -Follow up (please call to schedule if not scheduled after visit):   -yearly for annual wellness visit with primary care office  Here is a list of your preventive care/health maintenance measures and the plan for each if any are due:  PLAN For any measures below that may be due:  -if you decide to get the vaccines you can do them at the pharmacy  Health Maintenance  Topic Date Due   COVID-19 Vaccine (4 - 2023-24 season) 06/13/2023 (Originally 02/24/2023)   Zoster Vaccines- Shingrix (1 of 2) 06/20/2023 (Originally 03/23/1993)   Medicare Annual Wellness (AWV)  05/27/2024   DTaP/Tdap/Td (3 - Td or Tdap) 11/26/2032   Pneumonia Vaccine 27+ Years old  Completed   INFLUENZA VACCINE  Completed   HPV VACCINES  Aged Out   Colonoscopy  Discontinued    -See a dentist at least yearly  -Get your eyes checked and then per your eye specialist's recommendations  -Other issues addressed today:   -I have included below further information regarding a healthy whole foods based diet, physical activity guidelines for adults, stress management and opportunities for social connections. I hope you find this information useful.   -----------------------------------------------------------------------------------------------------------------------------------------------------------------------------------------------------------------------------------------------------------  NUTRITION: -eat real food: lots of colorful vegetables (half the plate)  and fruits -5-7 servings of vegetables and fruits per day (fresh or steamed is best), exp. 2 servings of vegetables with lunch and dinner and 2 servings of fruit per day. Berries and greens such as kale and collards are great choices.  -consume on a regular basis: whole grains (make sure first ingredient on label contains the word "whole"), fresh fruits, fish, nuts, seeds, healthy oils (such as olive oil, avocado oil, grape seed oil) -may eat small amounts of dairy and lean meat on occasion, but avoid processed meats such as ham, bacon, lunch meat, etc. -drink water -try to avoid fast food and pre-packaged foods, processed meat -most experts advise limiting sodium to < 2300mg  per day, should limit further is any chronic conditions such as high blood pressure, heart disease, diabetes, etc. The American Heart Association advised that < 1500mg  is is ideal -try to avoid foods that contain any ingredients with names you do not recognize  -try to avoid sugar/sweets (except for the natural sugar that occurs in fresh fruit) -try to avoid sweet drinks -try to avoid white rice, white bread, pasta (unless whole grain), white or yellow potatoes  EXERCISE GUIDELINES FOR ADULTS: -if you wish to increase your physical activity, do so gradually and with the approval of your doctor -STOP and seek medical care immediately if  you have any chest pain, chest discomfort or trouble breathing when starting or increasing exercise  -move and stretch your body, legs, feet and arms when sitting for long periods -Physical activity guidelines for optimal health in adults: -least 150 minutes per week of aerobic exercise (can talk, but not sing) once approved by your doctor, 20-30 minutes of sustained activity or two 10 minute episodes of sustained activity every day.  -resistance training at least 2 days per week if approved by your doctor -balance exercises 3+ days per week:   Stand somewhere where you have something sturdy  to hold onto if you lose balance.    1) lift up on toes, start with 5x per day and work up to 20x   2) stand and lift on leg straight out to the side so that foot is a few inches of the floor, start with 5x each side and work up to 20x each side   3) stand on one foot, start with 5 seconds each side and work up to 20 seconds on each side  If you need ideas or help with getting more active:  -Silver sneakers https://tools.silversneakers.com  -Walk with a Doc: http://www.duncan-williams.com/  -try to include resistance (weight lifting/strength building) and balance exercises twice per week: or the following link for ideas: http://castillo-powell.com/  BuyDucts.dk  STRESS MANAGEMENT: -can try meditating, or just sitting quietly with deep breathing while intentionally relaxing all parts of your body for 5 minutes daily -if you need further help with stress, anxiety or depression please follow up with your primary doctor or contact the wonderful folks at [facility]: [phone]  SOCIAL CONNECTIONS: -options in [place] if you wish to engage in more social and exercise related activities:  -Silver sneakers https://tools.silversneakers.com  -Walk with a Doc: http://www.duncan-williams.com/  -Check out the [facility] Active Adults 50+ section on the [place] [employer] (hiking clubs, book clubs, cards and games, chess, exercise classes, aquatic classes and much more) - see the website for details: https://www.[place].gov/departments/parks-recreation/active-adults50  -YouTube has lots of exercise videos for different ages and abilities as well  -[facility] (a variety of indoor and outdoor inperson activities for adults). [phone]. [address]  -Virtual Online Classes (a variety of topics): see seniorplanet.org or call [phone]  -consider  volunteering at a school, hospice center, church, senior center or elsewhere           Terressa Koyanagi, DO

## 2023-05-28 NOTE — Patient Instructions (Addendum)
I really enjoyed getting to talk with you today! I am available on Tuesdays and Thursdays for virtual visits if you have any questions or concerns, or if I can be of any further assistance.   CHECKLIST FROM ANNUAL WELLNESS VISIT:  -Follow up (please call to schedule if not scheduled after visit):   -yearly for annual wellness visit with primary care office  Here is a list of your preventive care/health maintenance measures and the plan for each if any are due:  PLAN For any measures below that may be due:  -if you decide to get the vaccines you can do them at the pharmacy  Health Maintenance  Topic Date Due   COVID-19 Vaccine (4 - 2023-24 season) 06/13/2023 (Originally 02/24/2023)   Zoster Vaccines- Shingrix (1 of 2) 06/20/2023 (Originally 03/23/1993)   Medicare Annual Wellness (AWV)  05/27/2024   DTaP/Tdap/Td (3 - Td or Tdap) 11/26/2032   Pneumonia Vaccine 66+ Years old  Completed   INFLUENZA VACCINE  Completed   HPV VACCINES  Aged Out   Colonoscopy  Discontinued    -See a dentist at least yearly  -Get your eyes checked and then per your eye specialist's recommendations  -Other issues addressed today:   -I have included below further information regarding a healthy whole foods based diet, physical activity guidelines for adults, stress management and opportunities for social connections. I hope you find this information useful.   -----------------------------------------------------------------------------------------------------------------------------------------------------------------------------------------------------------------------------------------------------------  NUTRITION: -eat real food: lots of colorful vegetables (half the plate) and fruits -5-7 servings of vegetables and fruits per day (fresh or steamed is best), exp. 2 servings of vegetables with lunch and dinner and 2 servings of fruit per day. Berries and greens such as kale and collards are great choices.   -consume on a regular basis: whole grains (make sure first ingredient on label contains the word "whole"), fresh fruits, fish, nuts, seeds, healthy oils (such as olive oil, avocado oil, grape seed oil) -may eat small amounts of dairy and lean meat on occasion, but avoid processed meats such as ham, bacon, lunch meat, etc. -drink water -try to avoid fast food and pre-packaged foods, processed meat -most experts advise limiting sodium to < 2300mg  per day, should limit further is any chronic conditions such as high blood pressure, heart disease, diabetes, etc. The American Heart Association advised that < 1500mg  is is ideal -try to avoid foods that contain any ingredients with names you do not recognize  -try to avoid sugar/sweets (except for the natural sugar that occurs in fresh fruit) -try to avoid sweet drinks -try to avoid white rice, white bread, pasta (unless whole grain), white or yellow potatoes  EXERCISE GUIDELINES FOR ADULTS: -if you wish to increase your physical activity, do so gradually and with the approval of your doctor -STOP and seek medical care immediately if you have any chest pain, chest discomfort or trouble breathing when starting or increasing exercise  -move and stretch your body, legs, feet and arms when sitting for long periods -Physical activity guidelines for optimal health in adults: -least 150 minutes per week of aerobic exercise (can talk, but not sing) once approved by your doctor, 20-30 minutes of sustained activity or two 10 minute episodes of sustained activity every day.  -resistance training at least 2 days per week if approved by your doctor -balance exercises 3+ days per week:   Stand somewhere where you have something sturdy to hold onto if you lose balance.    1) lift up on toes,  start with 5x per day and work up to 20x   2) stand and lift on leg straight out to the side so that foot is a few inches of the floor, start with 5x each side and work up to 20x  each side   3) stand on one foot, start with 5 seconds each side and work up to 20 seconds on each side  If you need ideas or help with getting more active:  -Silver sneakers https://tools.silversneakers.com  -Walk with a Doc: http://www.duncan-williams.com/  -try to include resistance (weight lifting/strength building) and balance exercises twice per week: or the following link for ideas: http://castillo-powell.com/  BuyDucts.dk  STRESS MANAGEMENT: -can try meditating, or just sitting quietly with deep breathing while intentionally relaxing all parts of your body for 5 minutes daily -if you need further help with stress, anxiety or depression please follow up with your primary doctor or contact the wonderful folks at [facility]: [phone]  SOCIAL CONNECTIONS: -options in [place] if you wish to engage in more social and exercise related activities:  -Silver sneakers https://tools.silversneakers.com  -Walk with a Doc: http://www.duncan-williams.com/  -Check out the [facility] Active Adults 50+ section on the [place] [employer] (hiking clubs, book clubs, cards and games, chess, exercise classes, aquatic classes and much more) - see the website for details: https://www.[place].gov/departments/parks-recreation/active-adults50  -YouTube has lots of exercise videos for different ages and abilities as well  -[facility] (a variety of indoor and outdoor inperson activities for adults). [phone]. [address]  -Virtual Online Classes (a variety of topics): see seniorplanet.org or call [phone]  -consider volunteering at a school, hospice center, church, senior center or elsewhere

## 2023-07-10 DIAGNOSIS — R918 Other nonspecific abnormal finding of lung field: Secondary | ICD-10-CM | POA: Diagnosis not present

## 2023-07-10 DIAGNOSIS — I10 Essential (primary) hypertension: Secondary | ICD-10-CM | POA: Diagnosis not present

## 2023-07-10 DIAGNOSIS — S20311A Abrasion of right front wall of thorax, initial encounter: Secondary | ICD-10-CM | POA: Diagnosis not present

## 2023-07-10 DIAGNOSIS — Z87891 Personal history of nicotine dependence: Secondary | ICD-10-CM | POA: Diagnosis not present

## 2023-07-10 DIAGNOSIS — R0781 Pleurodynia: Secondary | ICD-10-CM | POA: Diagnosis not present

## 2023-07-12 ENCOUNTER — Encounter: Payer: Self-pay | Admitting: Family Medicine

## 2023-07-12 DIAGNOSIS — E785 Hyperlipidemia, unspecified: Secondary | ICD-10-CM | POA: Diagnosis not present

## 2023-07-12 DIAGNOSIS — I10 Essential (primary) hypertension: Secondary | ICD-10-CM | POA: Diagnosis not present

## 2023-07-12 DIAGNOSIS — R0781 Pleurodynia: Secondary | ICD-10-CM | POA: Diagnosis not present

## 2023-08-06 DIAGNOSIS — Z85828 Personal history of other malignant neoplasm of skin: Secondary | ICD-10-CM | POA: Diagnosis not present

## 2023-08-06 DIAGNOSIS — L821 Other seborrheic keratosis: Secondary | ICD-10-CM | POA: Diagnosis not present

## 2023-08-06 DIAGNOSIS — L538 Other specified erythematous conditions: Secondary | ICD-10-CM | POA: Diagnosis not present

## 2023-08-06 DIAGNOSIS — L820 Inflamed seborrheic keratosis: Secondary | ICD-10-CM | POA: Diagnosis not present

## 2023-08-06 DIAGNOSIS — L814 Other melanin hyperpigmentation: Secondary | ICD-10-CM | POA: Diagnosis not present

## 2023-08-06 DIAGNOSIS — D225 Melanocytic nevi of trunk: Secondary | ICD-10-CM | POA: Diagnosis not present

## 2023-08-06 DIAGNOSIS — L2989 Other pruritus: Secondary | ICD-10-CM | POA: Diagnosis not present

## 2023-08-06 DIAGNOSIS — Z7189 Other specified counseling: Secondary | ICD-10-CM | POA: Diagnosis not present

## 2023-08-06 DIAGNOSIS — D2239 Melanocytic nevi of other parts of face: Secondary | ICD-10-CM | POA: Diagnosis not present

## 2023-08-06 DIAGNOSIS — L570 Actinic keratosis: Secondary | ICD-10-CM | POA: Diagnosis not present

## 2023-09-23 ENCOUNTER — Encounter: Payer: Self-pay | Admitting: Family Medicine

## 2023-09-23 ENCOUNTER — Ambulatory Visit (INDEPENDENT_AMBULATORY_CARE_PROVIDER_SITE_OTHER): Admitting: Family Medicine

## 2023-09-23 VITALS — BP 124/80 | HR 66 | Temp 98.2°F | Wt 161.2 lb

## 2023-09-23 DIAGNOSIS — M940 Chondrocostal junction syndrome [Tietze]: Secondary | ICD-10-CM | POA: Diagnosis not present

## 2023-09-23 DIAGNOSIS — R1011 Right upper quadrant pain: Secondary | ICD-10-CM

## 2023-09-23 NOTE — Progress Notes (Signed)
   Subjective:    Patient ID: Mason [name], male    DOB: [birth year], 83 y.o.   MRN: [id number]  HPI Here for 2 issues. For the last 4 months he has had an intermittent sharp pain in the anterior ribs just to the right of his sternum. No cough or SOB. No hx of trauma. He plays golf and walks on a treadmill, but he does not lift weights. He lives in [place] six months out of the year, and in January he saw someone there for this pain. They ordered a chest CT scan, and this was read as normal. Corban has tried Tylenol and Ibuprofen with no relief. The pain is annoying but never severe. The other issue is an intermittent  dull pain in the LUQ of his abdomen. This started about 2 months ago. No nausea or vomiting. His BM's are normal. The pain is most noticeable between meals. He sometimes takes TUMS for this, and it goes away quickly.    Review of Systems  Constitutional: Negative.   Respiratory: Negative.    Cardiovascular:  Positive for chest pain. Negative for palpitations and leg swelling.  Gastrointestinal:  Positive for abdominal pain. Negative for abdominal distention, blood in stool, constipation, diarrhea, nausea and vomiting.       Objective:   Physical Exam Constitutional:      Appearance: Normal appearance.  Cardiovascular:     Rate and Rhythm: Normal rate and regular rhythm.     Pulses: Normal pulses.     Heart sounds: Normal heart sounds.  Pulmonary:     Effort: Pulmonary effort is normal.     Breath sounds: Normal breath sounds.     Comments: He is mildly tender around the inferior right sternal margin. No crepitus.  Abdominal:     General: Abdomen is flat. Bowel sounds are normal. There is no distension.     Palpations: Abdomen is soft. There is no mass.     Tenderness: There is no abdominal tenderness. There is no guarding or rebound.     Hernia: No hernia is present.  Neurological:     Mental Status: He is alert.           Assessment & Plan:  He has  costochondritis, and he will try applying Voltaren gel as needed. His abdominal pain is likely due to duodenitis, and he will start taking Omeprazole OTC every morning.  Gershon Crane, MD

## 2023-09-26 ENCOUNTER — Ambulatory Visit: Admitting: Family Medicine

## 2023-11-08 DIAGNOSIS — R972 Elevated prostate specific antigen [PSA]: Secondary | ICD-10-CM | POA: Diagnosis not present

## 2023-11-08 DIAGNOSIS — R351 Nocturia: Secondary | ICD-10-CM | POA: Diagnosis not present

## 2023-11-14 DIAGNOSIS — R3129 Other microscopic hematuria: Secondary | ICD-10-CM | POA: Diagnosis not present

## 2023-11-14 DIAGNOSIS — Z789 Other specified health status: Secondary | ICD-10-CM | POA: Diagnosis not present

## 2023-11-14 DIAGNOSIS — N401 Enlarged prostate with lower urinary tract symptoms: Secondary | ICD-10-CM | POA: Diagnosis not present

## 2023-11-14 DIAGNOSIS — R351 Nocturia: Secondary | ICD-10-CM | POA: Diagnosis not present

## 2023-12-16 ENCOUNTER — Other Ambulatory Visit: Payer: Self-pay | Admitting: Family Medicine

## 2024-02-11 DIAGNOSIS — Z85828 Personal history of other malignant neoplasm of skin: Secondary | ICD-10-CM | POA: Diagnosis not present

## 2024-02-11 DIAGNOSIS — L570 Actinic keratosis: Secondary | ICD-10-CM | POA: Diagnosis not present

## 2024-02-11 DIAGNOSIS — Z7189 Other specified counseling: Secondary | ICD-10-CM | POA: Diagnosis not present

## 2024-02-11 DIAGNOSIS — L821 Other seborrheic keratosis: Secondary | ICD-10-CM | POA: Diagnosis not present

## 2024-02-11 DIAGNOSIS — D225 Melanocytic nevi of trunk: Secondary | ICD-10-CM | POA: Diagnosis not present

## 2024-02-11 DIAGNOSIS — L814 Other melanin hyperpigmentation: Secondary | ICD-10-CM | POA: Diagnosis not present

## 2024-03-17 ENCOUNTER — Other Ambulatory Visit: Payer: Self-pay | Admitting: Family Medicine

## 2024-03-27 DIAGNOSIS — M3501 Sicca syndrome with keratoconjunctivitis: Secondary | ICD-10-CM | POA: Diagnosis not present

## 2024-03-27 DIAGNOSIS — H2513 Age-related nuclear cataract, bilateral: Secondary | ICD-10-CM | POA: Diagnosis not present

## 2024-03-27 DIAGNOSIS — H43813 Vitreous degeneration, bilateral: Secondary | ICD-10-CM | POA: Diagnosis not present

## 2024-03-27 DIAGNOSIS — D3131 Benign neoplasm of right choroid: Secondary | ICD-10-CM | POA: Diagnosis not present

## 2024-04-01 ENCOUNTER — Encounter: Payer: Self-pay | Admitting: Family Medicine

## 2024-04-01 ENCOUNTER — Ambulatory Visit: Payer: Medicare Other | Admitting: Family Medicine

## 2024-04-01 VITALS — BP 124/68 | HR 52 | Temp 97.7°F | Ht 63.5 in | Wt 161.4 lb

## 2024-04-01 DIAGNOSIS — N401 Enlarged prostate with lower urinary tract symptoms: Secondary | ICD-10-CM

## 2024-04-01 DIAGNOSIS — I10 Essential (primary) hypertension: Secondary | ICD-10-CM

## 2024-04-01 DIAGNOSIS — E039 Hypothyroidism, unspecified: Secondary | ICD-10-CM | POA: Diagnosis not present

## 2024-04-01 DIAGNOSIS — N138 Other obstructive and reflux uropathy: Secondary | ICD-10-CM

## 2024-04-01 DIAGNOSIS — M150 Primary generalized (osteo)arthritis: Secondary | ICD-10-CM

## 2024-04-01 DIAGNOSIS — E782 Mixed hyperlipidemia: Secondary | ICD-10-CM | POA: Diagnosis not present

## 2024-04-01 DIAGNOSIS — Z23 Encounter for immunization: Secondary | ICD-10-CM | POA: Diagnosis not present

## 2024-04-01 DIAGNOSIS — R739 Hyperglycemia, unspecified: Secondary | ICD-10-CM | POA: Diagnosis not present

## 2024-04-01 LAB — CBC WITH DIFFERENTIAL/PLATELET
Basophils Absolute: 0 K/uL (ref 0.0–0.1)
Basophils Relative: 0.9 % (ref 0.0–3.0)
Eosinophils Absolute: 0.2 K/uL (ref 0.0–0.7)
Eosinophils Relative: 2.8 % (ref 0.0–5.0)
HCT: 42.6 % (ref 39.0–52.0)
Hemoglobin: 14.7 g/dL (ref 13.0–17.0)
Lymphocytes Relative: 29.2 % (ref 12.0–46.0)
Lymphs Abs: 1.5 K/uL (ref 0.7–4.0)
MCHC: 34.5 g/dL (ref 30.0–36.0)
MCV: 91.5 fl (ref 78.0–100.0)
Monocytes Absolute: 0.5 K/uL (ref 0.1–1.0)
Monocytes Relative: 8.9 % (ref 3.0–12.0)
Neutro Abs: 3.1 K/uL (ref 1.4–7.7)
Neutrophils Relative %: 58.2 % (ref 43.0–77.0)
Platelets: 210 K/uL (ref 150.0–400.0)
RBC: 4.65 Mil/uL (ref 4.22–5.81)
RDW: 13 % (ref 11.5–15.5)
WBC: 5.3 K/uL (ref 4.0–10.5)

## 2024-04-01 LAB — HEPATIC FUNCTION PANEL
ALT: 18 U/L (ref 0–53)
AST: 21 U/L (ref 0–37)
Albumin: 4.4 g/dL (ref 3.5–5.2)
Alkaline Phosphatase: 46 U/L (ref 39–117)
Bilirubin, Direct: 0.1 mg/dL (ref 0.0–0.3)
Total Bilirubin: 0.7 mg/dL (ref 0.2–1.2)
Total Protein: 6.6 g/dL (ref 6.0–8.3)

## 2024-04-01 LAB — BASIC METABOLIC PANEL WITH GFR
BUN: 14 mg/dL (ref 6–23)
CO2: 29 meq/L (ref 19–32)
Calcium: 9.6 mg/dL (ref 8.4–10.5)
Chloride: 100 meq/L (ref 96–112)
Creatinine, Ser: 1.07 mg/dL (ref 0.40–1.50)
GFR: 65.3 mL/min (ref >60.00)
Glucose, Bld: 100 mg/dL — ABNORMAL HIGH (ref 70–99)
Potassium: 4 meq/L (ref 3.5–5.1)
Sodium: 136 meq/L (ref 135–145)

## 2024-04-01 LAB — LIPID PANEL
Cholesterol: 136 mg/dL (ref 0–200)
HDL: 52.6 mg/dL (ref >39.00)
LDL Cholesterol: 68 mg/dL (ref 0–99)
NonHDL: 83.68
Total CHOL/HDL Ratio: 3
Triglycerides: 79 mg/dL (ref 0.0–149.0)
VLDL: 15.8 mg/dL (ref 0.0–40.0)

## 2024-04-01 LAB — T4, FREE: Free T4: 1.06 ng/dL (ref 0.60–1.60)

## 2024-04-01 LAB — TSH: TSH: 2.88 u[IU]/mL (ref 0.35–5.50)

## 2024-04-01 LAB — HEMOGLOBIN A1C: Hgb A1c MFr Bld: 5.5 % (ref 4.6–6.5)

## 2024-04-01 LAB — T3, FREE: T3, Free: 3.1 pg/mL (ref 2.3–4.2)

## 2024-04-01 MED ORDER — HYDROCHLOROTHIAZIDE 25 MG PO TABS: 25.0000 mg | ORAL_TABLET | Freq: Every day | ORAL | 3 refills | Status: AC

## 2024-04-01 MED ORDER — LEVOTHYROXINE SODIUM 75 MCG PO TABS: 75.0000 ug | ORAL_TABLET | Freq: Every day | ORAL | 3 refills | Status: AC

## 2024-04-01 MED ORDER — ROSUVASTATIN CALCIUM 10 MG PO TABS: 10.0000 mg | ORAL_TABLET | Freq: Every day | ORAL | 3 refills | Status: AC

## 2024-04-01 MED ORDER — POTASSIUM CHLORIDE ER 10 MEQ PO TBCR: EXTENDED_RELEASE_TABLET | ORAL | 3 refills | Status: AC

## 2024-04-01 MED ORDER — LOSARTAN POTASSIUM 100 MG PO TABS: 100.0000 mg | ORAL_TABLET | Freq: Every day | ORAL | 3 refills | Status: AC

## 2024-04-01 NOTE — Addendum Note (Signed)
 Addended by: LADONNA INOCENTE SAILOR on: 04/01/2024 04:48 PM   Modules accepted: Orders

## 2024-04-01 NOTE — Progress Notes (Signed)
 Subjective:    Patient ID: Mason [name], male    DOB: 17-Dec-1940, 83 y.o.   MRN: [id number]  HPI Here to follow up on issues. He feels well in general. He and his wife walk every day, and he enjoys playing golf and riding his bike. He will be leaving for [place]  in a few weeks, as he always spends his winters in [place] . He sees a Insurance underwriter there who follows his prostate. His last PSA was 3.4. His OA is stable. His BP is stable.    Review of Systems  Constitutional: Negative.   HENT: Negative.    Eyes: Negative.   Respiratory: Negative.    Cardiovascular: Negative.   Gastrointestinal: Negative.   Genitourinary: Negative.   Musculoskeletal:  Positive for arthralgias.  Skin: Negative.   Neurological: Negative.   Psychiatric/Behavioral: Negative.         Objective:   Physical Exam Constitutional:      General: He is not in acute distress.    Appearance: Normal appearance. He is well-developed. He is not diaphoretic.  HENT:     Head: Normocephalic and atraumatic.     Right Ear: External ear normal.     Left Ear: External ear normal.     Nose: Nose normal.     Mouth/Throat:     Pharynx: No oropharyngeal exudate.  Eyes:     General: No scleral icterus.       Right eye: No discharge.        Left eye: No discharge.     Conjunctiva/sclera: Conjunctivae normal.     Pupils: Pupils are equal, round, and reactive to light.  Neck:     Thyroid : No thyromegaly.     Vascular: No JVD.     Trachea: No tracheal deviation.  Cardiovascular:     Rate and Rhythm: Normal rate and regular rhythm.     Pulses: Normal pulses.     Heart sounds: Normal heart sounds. No murmur heard.    No friction rub. No gallop.  Pulmonary:     Effort: Pulmonary effort is normal. No respiratory distress.     Breath sounds: Normal breath sounds. No wheezing or rales.  Chest:     Chest wall: No tenderness.  Abdominal:     General: Bowel sounds are normal. There is no distension.     Palpations:  Abdomen is soft. There is no mass.     Tenderness: There is no abdominal tenderness. There is no guarding or rebound.  Genitourinary:    Penis: No tenderness.   Musculoskeletal:        General: No tenderness. Normal range of motion.     Cervical back: Neck supple.  Lymphadenopathy:     Cervical: No cervical adenopathy.  Skin:    General: Skin is warm and dry.     Coloration: Skin is not pale.     Findings: No erythema or rash.  Neurological:     General: No focal deficit present.     Mental Status: He is alert and oriented to person, place, and time.     Cranial Nerves: No cranial nerve deficit.     Motor: No abnormal muscle tone.     Coordination: Coordination normal.     Deep Tendon Reflexes: Reflexes are normal and symmetric. Reflexes normal.  Psychiatric:        Mood and Affect: Mood normal.        Behavior: Behavior normal.        Thought Content:  Thought content normal.        Judgment: Judgment normal.           Assessment & Plan:  His HTN and OA are stable. He will follow up with Urology for the BPH. We will get labs to check a thyroid  panel, lipids, etc. I personally spent a total of 32  minutes in the care of the patient today including getting/reviewing separately obtained history, performing a medically appropriate exam/evaluation, counseling and educating, and placing orders.  Garnette Olmsted, MD

## 2024-04-02 ENCOUNTER — Ambulatory Visit: Payer: Self-pay | Admitting: Family Medicine

## 2024-06-02 ENCOUNTER — Ambulatory Visit: Admitting: Family Medicine

## 2024-08-28 ENCOUNTER — Ambulatory Visit

## 2025-04-05 ENCOUNTER — Ambulatory Visit: Admitting: Family Medicine
